# Patient Record
Sex: Female | Born: 1955 | Race: White | Hispanic: No | Smoking: Never smoker
Health system: Southern US, Community
[De-identification: ages and names within clinical notes are randomized; demographics above are authoritative.]

## PROBLEM LIST (undated history)

## (undated) DIAGNOSIS — E785 Hyperlipidemia, unspecified: Secondary | ICD-10-CM

## (undated) DIAGNOSIS — Z9289 Personal history of other medical treatment: Secondary | ICD-10-CM

## (undated) DIAGNOSIS — K635 Polyp of colon: Secondary | ICD-10-CM

## (undated) DIAGNOSIS — O039 Complete or unspecified spontaneous abortion without complication: Secondary | ICD-10-CM

## (undated) DIAGNOSIS — F419 Anxiety disorder, unspecified: Secondary | ICD-10-CM

## (undated) DIAGNOSIS — Q913 Trisomy 18, unspecified: Secondary | ICD-10-CM

## (undated) DIAGNOSIS — F32A Depression, unspecified: Secondary | ICD-10-CM

## (undated) DIAGNOSIS — F439 Reaction to severe stress, unspecified: Secondary | ICD-10-CM

## (undated) DIAGNOSIS — I340 Nonrheumatic mitral (valve) insufficiency: Secondary | ICD-10-CM

## (undated) DIAGNOSIS — F329 Major depressive disorder, single episode, unspecified: Secondary | ICD-10-CM

## (undated) DIAGNOSIS — I493 Ventricular premature depolarization: Secondary | ICD-10-CM

## (undated) HISTORY — PX: MYOMECTOMY: SHX85

## (undated) HISTORY — DX: Anxiety disorder, unspecified: F41.9

## (undated) HISTORY — DX: Polyp of colon: K63.5

## (undated) HISTORY — DX: Major depressive disorder, single episode, unspecified: F32.9

## (undated) HISTORY — DX: Ventricular premature depolarization: I49.3

## (undated) HISTORY — DX: Personal history of other medical treatment: Z92.89

## (undated) HISTORY — DX: Complete or unspecified spontaneous abortion without complication: O03.9

## (undated) HISTORY — DX: Depression, unspecified: F32.A

## (undated) HISTORY — DX: Hyperlipidemia, unspecified: E78.5

## (undated) HISTORY — DX: Trisomy 18, unspecified: Q91.3

## (undated) HISTORY — DX: Nonrheumatic mitral (valve) insufficiency: I34.0

## (undated) HISTORY — DX: Reaction to severe stress, unspecified: F43.9

---

## 1998-01-27 ENCOUNTER — Other Ambulatory Visit: Admission: RE | Admit: 1998-01-27 | Discharge: 1998-01-27 | Payer: Self-pay | Admitting: Obstetrics and Gynecology

## 1998-08-17 ENCOUNTER — Inpatient Hospital Stay (HOSPITAL_COMMUNITY): Admission: AD | Admit: 1998-08-17 | Discharge: 1998-08-20 | Payer: Self-pay | Admitting: Gynecology

## 1998-08-25 ENCOUNTER — Encounter (HOSPITAL_COMMUNITY): Admission: RE | Admit: 1998-08-25 | Discharge: 1998-11-23 | Payer: Self-pay | Admitting: Gynecology

## 1998-09-20 ENCOUNTER — Other Ambulatory Visit: Admission: RE | Admit: 1998-09-20 | Discharge: 1998-09-20 | Payer: Self-pay | Admitting: Gynecology

## 1999-05-16 ENCOUNTER — Other Ambulatory Visit: Admission: RE | Admit: 1999-05-16 | Discharge: 1999-05-16 | Payer: Self-pay | Admitting: *Deleted

## 1999-10-03 ENCOUNTER — Ambulatory Visit (HOSPITAL_COMMUNITY): Admission: RE | Admit: 1999-10-03 | Discharge: 1999-10-03 | Payer: Self-pay | Admitting: *Deleted

## 1999-10-03 ENCOUNTER — Encounter: Payer: Self-pay | Admitting: *Deleted

## 1999-11-06 ENCOUNTER — Other Ambulatory Visit: Admission: RE | Admit: 1999-11-06 | Discharge: 1999-11-06 | Payer: Self-pay | Admitting: Internal Medicine

## 2000-04-21 ENCOUNTER — Inpatient Hospital Stay (HOSPITAL_COMMUNITY): Admission: AD | Admit: 2000-04-21 | Discharge: 2000-04-23 | Payer: Self-pay | Admitting: *Deleted

## 2000-04-21 ENCOUNTER — Encounter (INDEPENDENT_AMBULATORY_CARE_PROVIDER_SITE_OTHER): Payer: Self-pay | Admitting: Specialist

## 2001-02-12 ENCOUNTER — Other Ambulatory Visit: Admission: RE | Admit: 2001-02-12 | Discharge: 2001-02-12 | Payer: Self-pay | Admitting: Gynecology

## 2001-03-11 ENCOUNTER — Encounter: Admission: RE | Admit: 2001-03-11 | Discharge: 2001-06-09 | Payer: Self-pay | Admitting: Gynecology

## 2001-03-20 ENCOUNTER — Ambulatory Visit (HOSPITAL_COMMUNITY): Admission: RE | Admit: 2001-03-20 | Discharge: 2001-03-20 | Payer: Self-pay | Admitting: Gynecology

## 2001-03-20 ENCOUNTER — Encounter: Payer: Self-pay | Admitting: Gynecology

## 2002-03-05 ENCOUNTER — Other Ambulatory Visit: Admission: RE | Admit: 2002-03-05 | Discharge: 2002-03-05 | Payer: Self-pay | Admitting: Gynecology

## 2002-03-22 ENCOUNTER — Encounter: Payer: Self-pay | Admitting: Gynecology

## 2002-03-22 ENCOUNTER — Ambulatory Visit (HOSPITAL_COMMUNITY): Admission: RE | Admit: 2002-03-22 | Discharge: 2002-03-22 | Payer: Self-pay | Admitting: Gynecology

## 2002-09-09 HISTORY — PX: CARPAL TUNNEL RELEASE: SHX101

## 2003-03-23 ENCOUNTER — Ambulatory Visit (HOSPITAL_BASED_OUTPATIENT_CLINIC_OR_DEPARTMENT_OTHER): Admission: RE | Admit: 2003-03-23 | Discharge: 2003-03-23 | Payer: Self-pay | Admitting: *Deleted

## 2003-04-12 ENCOUNTER — Ambulatory Visit (HOSPITAL_COMMUNITY): Admission: RE | Admit: 2003-04-12 | Discharge: 2003-04-12 | Payer: Self-pay | Admitting: Gynecology

## 2003-04-12 ENCOUNTER — Encounter: Payer: Self-pay | Admitting: Gynecology

## 2003-04-13 ENCOUNTER — Other Ambulatory Visit: Admission: RE | Admit: 2003-04-13 | Discharge: 2003-04-13 | Payer: Self-pay | Admitting: Gynecology

## 2004-02-03 ENCOUNTER — Encounter: Admission: RE | Admit: 2004-02-03 | Discharge: 2004-02-09 | Payer: Self-pay | Admitting: Family Medicine

## 2004-04-20 ENCOUNTER — Ambulatory Visit (HOSPITAL_COMMUNITY): Admission: RE | Admit: 2004-04-20 | Discharge: 2004-04-20 | Payer: Self-pay | Admitting: Gynecology

## 2004-05-21 ENCOUNTER — Other Ambulatory Visit: Admission: RE | Admit: 2004-05-21 | Discharge: 2004-05-21 | Payer: Self-pay | Admitting: Gynecology

## 2005-01-28 ENCOUNTER — Encounter: Admission: RE | Admit: 2005-01-28 | Discharge: 2005-03-04 | Payer: Self-pay | Admitting: Family Medicine

## 2005-05-17 ENCOUNTER — Ambulatory Visit (HOSPITAL_COMMUNITY): Admission: RE | Admit: 2005-05-17 | Discharge: 2005-05-17 | Payer: Self-pay | Admitting: Gynecology

## 2005-05-27 ENCOUNTER — Other Ambulatory Visit: Admission: RE | Admit: 2005-05-27 | Discharge: 2005-05-27 | Payer: Self-pay | Admitting: Gynecology

## 2005-05-29 ENCOUNTER — Encounter: Admission: RE | Admit: 2005-05-29 | Discharge: 2005-05-29 | Payer: Self-pay | Admitting: Gynecology

## 2005-09-27 ENCOUNTER — Encounter (INDEPENDENT_AMBULATORY_CARE_PROVIDER_SITE_OTHER): Payer: Self-pay | Admitting: Specialist

## 2005-09-27 ENCOUNTER — Ambulatory Visit (HOSPITAL_BASED_OUTPATIENT_CLINIC_OR_DEPARTMENT_OTHER): Admission: RE | Admit: 2005-09-27 | Discharge: 2005-09-27 | Payer: Self-pay | Admitting: Surgery

## 2006-03-04 HISTORY — PX: TOTAL ABDOMINAL HYSTERECTOMY W/ BILATERAL SALPINGOOPHORECTOMY: SHX83

## 2006-03-05 ENCOUNTER — Encounter (INDEPENDENT_AMBULATORY_CARE_PROVIDER_SITE_OTHER): Payer: Self-pay | Admitting: *Deleted

## 2006-03-05 ENCOUNTER — Inpatient Hospital Stay (HOSPITAL_COMMUNITY): Admission: RE | Admit: 2006-03-05 | Discharge: 2006-03-06 | Payer: Self-pay | Admitting: Gynecology

## 2006-05-30 ENCOUNTER — Encounter: Admission: RE | Admit: 2006-05-30 | Discharge: 2006-05-30 | Payer: Self-pay | Admitting: Gynecology

## 2006-06-02 ENCOUNTER — Other Ambulatory Visit: Admission: RE | Admit: 2006-06-02 | Discharge: 2006-06-02 | Payer: Self-pay | Admitting: Gynecology

## 2007-07-06 ENCOUNTER — Other Ambulatory Visit: Admission: RE | Admit: 2007-07-06 | Discharge: 2007-07-06 | Payer: Self-pay | Admitting: Gynecology

## 2007-09-10 DIAGNOSIS — K635 Polyp of colon: Secondary | ICD-10-CM

## 2007-09-10 HISTORY — DX: Polyp of colon: K63.5

## 2008-02-11 ENCOUNTER — Encounter: Admission: RE | Admit: 2008-02-11 | Discharge: 2008-02-11 | Payer: Self-pay | Admitting: Gynecology

## 2008-07-25 ENCOUNTER — Encounter: Payer: Self-pay | Admitting: Gynecology

## 2008-07-25 ENCOUNTER — Ambulatory Visit: Payer: Self-pay | Admitting: Gynecology

## 2008-07-25 ENCOUNTER — Other Ambulatory Visit: Admission: RE | Admit: 2008-07-25 | Discharge: 2008-07-25 | Payer: Self-pay | Admitting: Gynecology

## 2008-08-10 ENCOUNTER — Ambulatory Visit: Payer: Self-pay | Admitting: Gynecology

## 2009-02-08 ENCOUNTER — Ambulatory Visit (HOSPITAL_COMMUNITY): Admission: RE | Admit: 2009-02-08 | Discharge: 2009-02-09 | Payer: Self-pay | Admitting: Orthopedic Surgery

## 2009-06-20 ENCOUNTER — Ambulatory Visit: Payer: Self-pay | Admitting: Gynecology

## 2009-10-31 ENCOUNTER — Other Ambulatory Visit: Admission: RE | Admit: 2009-10-31 | Discharge: 2009-10-31 | Payer: Self-pay | Admitting: Gynecology

## 2009-10-31 ENCOUNTER — Ambulatory Visit: Payer: Self-pay | Admitting: Gynecology

## 2009-10-31 ENCOUNTER — Encounter: Payer: Self-pay | Admitting: Family Medicine

## 2009-12-08 ENCOUNTER — Encounter: Admission: RE | Admit: 2009-12-08 | Discharge: 2009-12-08 | Payer: Self-pay | Admitting: Gynecology

## 2009-12-22 ENCOUNTER — Encounter: Payer: Self-pay | Admitting: Family Medicine

## 2009-12-22 ENCOUNTER — Ambulatory Visit: Payer: Self-pay | Admitting: Gynecology

## 2010-04-09 ENCOUNTER — Ambulatory Visit: Payer: Self-pay | Admitting: Family Medicine

## 2010-04-09 DIAGNOSIS — E7800 Pure hypercholesterolemia, unspecified: Secondary | ICD-10-CM

## 2010-04-09 DIAGNOSIS — Z860100 Personal history of colon polyps, unspecified: Secondary | ICD-10-CM | POA: Insufficient documentation

## 2010-04-09 HISTORY — DX: Pure hypercholesterolemia, unspecified: E78.00

## 2010-10-09 NOTE — Letter (Signed)
Summary: Notes Dated 06-20-09 thru 11-01-09/[facility]  Notes Dated 06-20-09 thru 11-01-09/[facility]   Imported By: Lanelle Bal 04/18/2010 12:22:36  _____________________________________________________________________  External Attachment:    Type:   Image     Comment:   External Document

## 2010-10-09 NOTE — Assessment & Plan Note (Signed)
Summary: new to estab/cbs   Vital Signs:  Patient profile:   55 year old female Height:      62 inches (157.48 cm) Weight:      116.50 pounds (52.95 kg) BMI:     21.39 Temp:     98.4 degrees F (36.89 degrees C) oral BP sitting:   110 / 70  (right arm) Cuff size:   regular  Vitals Entered By: Lucious Groves CMA (April 09, 2010 10:19 AM) CC: NP est care./kb Is Patient Diabetic? No Pain Assessment Patient in pain? no        History of Present Illness: Pt here to establish.  Pt only c/o tender "lymph nodes" under arms b/l R>L.  Mammogram normal .   They recommended [place] but Dr Lily Peer did not feel anything at the time so it was not ordered.   No other complaints.   Symptoms since January.    Current Medications (verified): 1)  Hormone Patch .... Twice Per Week 2)  Mvi .... Daily 3)  Biotin .... Daily 4)  Calcium .... Daily 5)  Vitamin D3 .... Daily 6)  Fish Oil .... Daily 7)  Tumeric .... Daily  Allergies (verified): 1)  ! Amoxicillin (Amoxicillin)  Past History:  Family History: Last updated: 04/09/2010 Family History High cholesterol Family History Hypertension  Social History: Last updated: 04/09/2010 Occupation: hairstylist  Past Medical History: Colonic polyps, hx of Hyperlipidemia  Past Surgical History: Caesarean section x2 Carpal tunnel release--2004 Broken Ankle--2010 Hysterectomy--2008 Breast  Bx--2007 Fibroid removal--1997  Family History: Reviewed history and no changes required. Family History High cholesterol Family History Hypertension  Social History: Reviewed history and no changes required. Occupation: Scientist, research (medical) Occupation:  employed  Review of Systems      See HPI  Physical Exam  General:  Well-developed,well-nourished,in no acute distress; alert,appropriate and cooperative throughout examination Neck:  No deformities, masses, or tenderness noted. Lungs:  Normal respiratory effort, chest expands symmetrically. Lungs are  clear to auscultation, no crackles or wheezes. Heart:  normal rate and no murmur.   Extremities:  No clubbing, cyanosis, edema, or deformity noted with normal full range of motion of all joints.   Cervical Nodes:  No lymphadenopathy noted Axillary Nodes:  No palpable lymphadenopathy Psych:  Cognition and judgment appear intact. Alert and cooperative with normal attention span and concentration. No apparent delusions, illusions, hallucinations   Impression & Recommendations:  Problem # 1:  ? of LYMPHADENOPATHY, AXILLA (ICD-785.6) nothing palpated today pt left before we could get labs from gyn--- ( she went to bathroom and thought she was done) we are in process of getting labs done by gyn--- they we will call pt if more labs are needed Otherwise pt should return when symptoms return  Complete Medication List: 1)  Hormone Patch  .... Twice per week 2)  Mvi  .... Daily 3)  Biotin  .... Daily 4)  Calcium  .... Daily 5)  Vitamin D3  .... Daily 6)  Fish Oil  .... Daily 7)  Tumeric  .... Daily  Other Orders: Tdap => 84yrs IM [phone]) Admin 1st Vaccine ([id number])  Preventive Care Screening  Mammogram:    Date:  10/10/2009    Results:  normal   Pap Smear:    Date:  10/10/2009    Results:  normal   Colonoscopy:    Date:  09/09/2006    Results:  polyps    Flu Vaccine Result Date:  06/19/2005 Flu Vaccine Result:  given Flu Vaccine Next Due:  1 yr Colonoscopy Result Date:  04/22/2007 Colonoscopy Result:  polyps Colonoscopy Next Due:  5 yr PAP Result Date:  10/25/2009 PAP Result:  normal PAP Next Due:  1 yr Mammogram Result Date:  10/18/2009 Mammogram Result:  normal Mammogram Next Due:  1 yr   Immunizations Administered:  Tetanus Vaccine:    Vaccine Type: Tdap    Site: right deltoid    Mfr: GlaxoSmithKline    Dose: 0.5 ml    Route: IM    Given by: Lucious Groves CMA    Exp. Date: 12/02/2011    Lot #: [id number]    VIS given: 07/28/07 version given April 09, 2010.   Appended Document: new to estab/cbs OV notes from Dr Lily Peer received---- only cmp done---- we need to do CBCD at pt convenience  786.5   Clinical Lists Changes       Appended Document: new to estab/cbs Left message on machine to call back to office and schedule labs.  Appended Document: new to estab/cbs left detail message with pt husband to have pt to call to schedule lab appt for cbcd 786.5

## 2010-10-09 NOTE — Letter (Signed)
Summary: [facility]   Imported By: Lanelle Bal 04/18/2010 12:21:23  _____________________________________________________________________  External Attachment:    Type:   Image     Comment:   External Document

## 2010-10-09 NOTE — Letter (Signed)
Summary: [facility]   Imported By: Lanelle Bal 04/18/2010 12:25:28  _____________________________________________________________________  External Attachment:    Type:   Image     Comment:   External Document

## 2010-11-05 ENCOUNTER — Ambulatory Visit (INDEPENDENT_AMBULATORY_CARE_PROVIDER_SITE_OTHER): Payer: BC Managed Care – PPO | Admitting: Nurse Practitioner

## 2010-11-05 DIAGNOSIS — R079 Chest pain, unspecified: Secondary | ICD-10-CM

## 2010-11-05 DIAGNOSIS — I10 Essential (primary) hypertension: Secondary | ICD-10-CM

## 2010-11-12 ENCOUNTER — Telehealth (INDEPENDENT_AMBULATORY_CARE_PROVIDER_SITE_OTHER): Payer: Self-pay | Admitting: Radiology

## 2010-11-13 ENCOUNTER — Encounter: Payer: Self-pay | Admitting: Cardiology

## 2010-11-13 ENCOUNTER — Encounter (INDEPENDENT_AMBULATORY_CARE_PROVIDER_SITE_OTHER): Payer: Self-pay | Admitting: *Deleted

## 2010-11-13 ENCOUNTER — Ambulatory Visit (HOSPITAL_COMMUNITY): Payer: BC Managed Care – PPO | Attending: Cardiology

## 2010-11-13 DIAGNOSIS — R0609 Other forms of dyspnea: Secondary | ICD-10-CM

## 2010-11-13 DIAGNOSIS — R079 Chest pain, unspecified: Secondary | ICD-10-CM | POA: Insufficient documentation

## 2010-11-13 DIAGNOSIS — R0602 Shortness of breath: Secondary | ICD-10-CM

## 2010-11-13 DIAGNOSIS — R9431 Abnormal electrocardiogram [ECG] [EKG]: Secondary | ICD-10-CM

## 2010-11-13 DIAGNOSIS — R0789 Other chest pain: Secondary | ICD-10-CM

## 2010-11-16 ENCOUNTER — Ambulatory Visit (INDEPENDENT_AMBULATORY_CARE_PROVIDER_SITE_OTHER): Payer: BC Managed Care – PPO | Admitting: Nurse Practitioner

## 2010-11-16 DIAGNOSIS — I10 Essential (primary) hypertension: Secondary | ICD-10-CM

## 2010-11-16 DIAGNOSIS — E7800 Pure hypercholesterolemia, unspecified: Secondary | ICD-10-CM

## 2010-11-20 NOTE — Assessment & Plan Note (Signed)
Summary: Cardiology Nuclear Testing  Nuclear Med Background Indications for Stress Test: Evaluation for Ischemia   History: Abnormal EKG, Myocardial Perfusion Study   Symptoms: Chest Pain, Chest Pressure, DOE, Fatigue, Palpitations, SOB    Nuclear Pre-Procedure Cardiac Risk Factors: Hypertension, Lipids Caffeine/Decaff Intake: none NPO After: 12:30 AM Lungs: clear IV 0.9% NS with Angio Cath: 18g     IV Site: R Antecubital IV Started by: Stanton Kidney, EMT-P Chest Size (in) 32     Cup Size B     Height (in): 62 Weight (lb): 118 BMI: 21.66 Tech Comments: Bystolic held > 24 hours, per patient.  Nuclear Med Study 1 or 2 day study:  1 day     Stress Test Type:  Stress Reading MD:  Cassell Clement, MD     Referring MD:  T.Brackbill Resting Radionuclide Dose:  11 mCi  Stress Radionuclide Dose:  32.9 mCi   Stress Protocol Exercise Time (min):  11:00 min     Max HR:  142 bpm     Predicted Max HR:  167 bpm  Max Systolic BP: 142 mm Hg     Percent Max HR:  85.03 %     METS: 13.3 Rate Pressure Product:  16109    Stress Test Technologist:  Milana Na, EMT-P     Nuclear Technologist:  Domenic Polite, CNMT  Rest Procedure  Myocardial perfusion imaging was performed at rest 45 minutes following the intravenous administration of Technetium 69m Tetrofosmin.  Stress Procedure  The patient exercised for 11:00. The patient stopped due to fatigue, doe, and denied any chest pain.  There were non specific ST-T wave changes.  Technetium 6m Tetrofosmin was injected at peak exercise and myocardial perfusion imaging was performed after a brief delay.  QPS Raw Data Images:  Normal; no motion artifact; normal heart/lung ratio. Stress Images:  Normal homogeneous uptake in all areas of the myocardium. Rest Images:  Normal homogeneous uptake in all areas of the myocardium. Subtraction (SDS):  No evidence of ischemia. Transient Ischemic Dilatation:  1.01  (Normal <1.22)  Lung/Heart Ratio:  .27   (Normal <0.45)  Quantitative Gated Spect Images QGS EDV:  87 ml QGS ESV:  44 ml QGS EF:  50 % QGS cine images:  No wall motion abnormalities  Findings Normal nuclear study      Overall Impression  Exercise Capacity: Good exercise capacity. BP Response: Normal blood pressure response. Clinical Symptoms: No chest pain ECG Impression: No significant ST segment change suggestive of ischemia. Overall Impression: Normal stress nuclear study.  Appended Document: Cardiology Nuclear Testing COPY SENT TO DR.BRACKBILL

## 2010-11-20 NOTE — Progress Notes (Signed)
Summary: Nuclear Pre-Procedure  Phone Note Outgoing Call Call back at [facility] Phone [phone]   Call placed by: Stanton Kidney, EMT-P,  November 12, 2010 3:47 PM Call placed to: Patient Action Taken: Phone Call Completed Reason for Call: Confirm/change Appt Summary of Call: Reviewed information on Myoview Information Sheet (see scanned document for further details).  Spoke with the patient; husband. Stanton Kidney, EMT-P  November 12, 2010 3:47 PM      Nuclear Med Background Indications for Stress Test: Evaluation for Ischemia   History: Abnormal EKG, Myocardial Perfusion Study   Symptoms: Chest Pain, Chest Pressure, Palpitations    Nuclear Pre-Procedure Cardiac Risk Factors: Hypertension, Lipids Height (in): 62

## 2010-12-17 LAB — BASIC METABOLIC PANEL
BUN: 11 mg/dL (ref 6–23)
Calcium: 9.5 mg/dL (ref 8.4–10.5)
Chloride: 102 mEq/L (ref 96–112)
Creatinine, Ser: 0.68 mg/dL (ref 0.4–1.2)
GFR calc Af Amer: >60 mL/min (ref >60)
GFR calc non Af Amer: >60 mL/min (ref >60)
Glucose, Bld: 89 mg/dL (ref 70–99)
Potassium: 4.3 mEq/L (ref 3.5–5.1)

## 2010-12-17 LAB — CBC
HCT: 43.7 % (ref 36.0–46.0)
MCHC: 34.4 g/dL (ref 30.0–36.0)
MCV: 92.1 fL (ref 78.0–100.0)
WBC: 8.7 10*3/uL (ref 4.0–10.5)

## 2010-12-17 LAB — PROTIME-INR: Prothrombin Time: 13 seconds (ref 11.6–15.2)

## 2010-12-27 ENCOUNTER — Other Ambulatory Visit: Payer: Self-pay | Admitting: *Deleted

## 2010-12-27 DIAGNOSIS — E7800 Pure hypercholesterolemia, unspecified: Secondary | ICD-10-CM

## 2010-12-31 ENCOUNTER — Other Ambulatory Visit: Payer: BC Managed Care – PPO | Admitting: *Deleted

## 2011-01-22 NOTE — Op Note (Signed)
NAMETRINI, SOLDO              ACCOUNT NO.:  [id number]   MEDICAL RECORD NO.:  [id number]          PATIENT TYPE:  OIB   LOCATION:  2550                         FACILITY:  [facility]   PHYSICIAN:  Burnard Bunting, M.D.    DATE OF BIRTH:  [birth year]   DATE OF PROCEDURE:  02/08/2009  DATE OF DISCHARGE:                               OPERATIVE REPORT   PREOPERATIVE DIAGNOSIS:  Left ankle lateral malleolus fracture.   POSTOPERATIVE DIAGNOSIS:  Left ankle lateral malleolus fracture with  syndesmosis injury.   PROCEDURE:  Left ankle lateral malleolus fracture open reduction and  internal fixation with fixation of syndesmosis.   SURGEON:  Burnard Bunting, MD   ASSISTANT:  None.   ANESTHESIA:  General.   INDICATIONS:  [name] is a 55 year old patient who injured her  ankle while doing some type of surfing [place] the beach who presents now  with an unstable ankle and presents for operative management.   PROCEDURE IN DETAIL:  The patient was brought to the operating room  where general endotracheal anesthesia was induced.  Preoperative  antibiotics were administered.  Left ankle was prescrubbed with  chlorhexidine alcohol and Betadine which allowed to air dry, then  prepped with DuraPrep solution and draped in a sterile manner.  Collier Flowers  was used to cover the operative field.  Ankle Esmarch was utilized for  approximately an hour.  Lateral incision was made over the lateral  malleolus and extending up to the fibular shaft.  Skin and subcutaneous  tissue were sharply divided.  Fracture was identified.  Care was taken  to avoid injury to the anterior branch superficial peroneal nerve.  The  fracture was reduced and the lag screw was placed.  Good reduction of  the fracture was achieved in the AP and lateral planes under  fluoroscopy.  Lateral plate was then applied.  After the lateral plate  was applied, extensive syndesmotic inspection was performed.  The medial  clear space widened by  approximately 2.5 to 3 mm with lateral stress.  This was observed under live fluoroscopy.  With stress, there was also  an audible vacuum phenomenon where air was being suctioned to the joint  because of the lability of the talus.  Even though the fracture pattern  itself was not necessarily consistent with syndesmotic injury, the  actual live fluoroscopic stress did demonstrate medial clear space  widening with lateral stress after fixation of the fibula.  For this  reason, a single syndesmotic screw was placed through the plate at about  1.5 cm above the joint, which did give a stable fixation and improved  the manual stability and fluorographic stability of the ankle.  At this  time, incision was thoroughly irrigated.  Ankle Esmarch  was released.  The incision was then closed using interrupted inverted 3-  0 Vicryl suture and 3-0 nylon suture.  The patient tolerated the  procedure well without immediate complications.  Bulky well-padded  splint was applied.  She was transferred to recovery room in stable  condition.      Burnard Bunting, M.D.  Electronically Signed  GSD/MEDQ  D:  02/08/2009  T:  02/09/2009  Job:  [id number]

## 2011-01-24 ENCOUNTER — Telehealth: Payer: Self-pay | Admitting: Cardiology

## 2011-01-24 NOTE — Telephone Encounter (Signed)
Has not slept much since Friday.  Husband passed away on Jan 21, 2023.  blood pressure 150/119 and was very short of breath.  She thought it was just anxiety until she took blood pressure.  Took 2 bystolic yesterday.  Took 1st 1 and blood pressure down 140's/87-95 about 2 1/2 hours later, so she took a second pill for a total of 10 mg bystolic.  Wants to know if she should increase to 10 mg daily and if so should she do bid or both at one time.  Please advise.

## 2011-01-24 NOTE — Telephone Encounter (Signed)
States she will call back if needs xanax.  Advised to call if systolic below 100

## 2011-01-24 NOTE — Telephone Encounter (Signed)
Advised to increase, doesn't want any xanax at this time.

## 2011-01-24 NOTE — Telephone Encounter (Signed)
Increase to 10 mg bystolic all at one time. does she also needs something for anxiety such as Xanax ?

## 2011-01-24 NOTE — Telephone Encounter (Signed)
PT'S HUSBAND RECENTLY PASSED AWAY UNEXPECTANTLY, SHE IS HAVING PROBLEMS WITH HER BP AND NEEDS TO SW SOMEONE AS TO WHAT SHE NEEDS TO DO WITH HER MEDS. CHART IN BOX.

## 2011-01-25 NOTE — Op Note (Signed)
[name], [name]              ACCOUNT NO.:  [id number]   MEDICAL RECORD NO.:  [id number]          PATIENT TYPE:  AMB   LOCATION:  SDC                           FACILITY:  [facility]   PHYSICIAN:  Juan H. Lily Peer, M.D.DATE OF BIRTH:  May 20, 1956   DATE OF PROCEDURE:  03/05/2006  DATE OF DISCHARGE:                                 OPERATIVE REPORT   INDICATIONS FOR OPERATION:  55 year old gravida 4, para 1, ab 3 with  symptomatic leiomyomata uteri.   PREOPERATIVE DIAGNOSES:  1.  Leiomyomata uteri.  2. Pelvic pain.  3. Postmenopausal bleeding.  4.      Postmenopausal.   POSTOPERATIVE DIAGNOSES:  1.  Leiomyomata uteri.  2. Pelvic pain.  3. Postmenopausal bleeding.  4.      Postmenopausal.   ANESTHESIA:  General endotracheal anesthesia.   PROCEDURE PERFORMED:  1.  Abdominal pelvic adhesive lysis.  2.  Total abdominal hysterectomy, bilateral salpingo-oophorectomy.   FIRST ASSISTANT:  Douglass Rivers, MD   FINDINGS:  Patient with a 14 weeks' size uterus adhered to the anterior  abdominal wall with thick adhesions.  Normal-appearing tubes and ovaries,  and normal-appearing appendix.   DESCRIPTION OF OPERATION:  After the patient was adequately counseled, she  was taken to the operating room where she underwent successful general  endotracheal anesthesia.  She had received clindamycin 900 mg IV for  prophylaxis and PSA stockings were in place for DVT prophylaxis.  After  general endotracheal anesthesia was obtained, the abdomen was prepped and  draped in usual sterile fashion.  A Foley catheter was inserted in an effort  to monitor urinary output.  After the drapes were placed, a Pfannenstiel  skin incision was made adjacent to the previous Pfannenstiel scar.  The  incision was carried out the skin, subcutaneous tissue down to the rectus  fascia whereby midline nick was made, the fascia was incised in transverse  fashion.  The peritoneal cavity was entered cautiously requiring  meticulous  dissection to separate the uterus from the anterior surface of the  peritoneum.  Once this was accomplished, the uterus with the enlarged  fibroids was brought out the operative field and the right round ligament  was identified, was suture ligated with 0 Vicryl suture.  The right round  ligament was transected.  The anterior broad ligament was incised peeling  down the bladder.  The posterior broad ligament was penetrated with  surgeon's finger and the left infundibulopelvic ligament was clamped, cut  and suture ligated with 0 Vicryl suture followed by a free tie of 0 Vicryl  suture.  In similar procedure was carried out on the contralateral side  until the remaining broad cardinal ligaments were serially clamped, cut and  suture ligated with 0 Vicryl suture to the level of the vaginal fornices.  Two curved Heaney clamps were placed and the cervix was excised from the  vagina and cervix, uterus, tubes and ovary were passed off to the operative  field for histological evaluation.  The angles were secured with a  transfixion stitch of 0 Vicryl suture and the remaining vaginal cuff was  closed  with figure-of-eight of 0 Vicryl suture.  The pelvic cavity was  copiously irrigated with normal saline solution.  Surgicel was placed onto  the raw surfaces of the peritoneum for additional hemostasis.  Sponge and  needle count were correct.  The rectus muscle was reapproximated in the  midline with several interrupted sutures to prevent diastasis recti.  Following this, the rectus fascia was then closed with a running stitch of 0  Vicryl suture.  Subcutaneous bleeders were Bovie cauterized.  The skin was  reapproximated with a subcuticular stitch of 3-0 Vicryl suture on a Keith  needle followed by placement of Steri-Strips and pressure dressing.  The  patient was extubated, transferred to recovery room with stable vital signs.  Blood loss from procedure was 150 mL.  Urine output was 50 mL  and clear. IV  fluid 1100 mL lactated Ringer's.      Juan H. Lily Peer, M.D.  Electronically Signed     Gaetano Hawthorne. Lily Peer, M.D.  Electronically Signed    JHF/MEDQ  D:  03/05/2006  T:  03/05/2006  Job:  [id number]

## 2011-01-25 NOTE — Discharge Summary (Signed)
[name], [name]              ACCOUNT NO.:  [id number]   MEDICAL RECORD NO.:  [id number]          PATIENT TYPE:  INP   LOCATION:  9312                          FACILITY:  [facility]   PHYSICIAN:  Juan H. Lily Peer, M.D.DATE OF BIRTH:  12-12-1955   DATE OF ADMISSION:  03/05/2006  DATE OF DISCHARGE:  03/06/2006                                 DISCHARGE SUMMARY   HISTORY:  The patient is a 55 year old gravida 4 para 1 NB 3 with  symptomatic leiomyomatous uteri who underwent a total abdominal hysterectomy  with bilateral salpingo-oophorectomy by Dr. Gaetano Hawthorne. Lily Peer and assisted  by Dr. Douglass Rivers.  The patient had a 14-week sized uterus that had  adhered to the abdominal wall and was thick and had adhesions;  otherwise,  normal appearing tubes and ovaries and normal-appearing appendix.  The  pathology report had demonstrated benign cervix, benign proliferative  endometrium, adenomyosis and benign leiomyomas and serosa with adhesions.  The right and left ovary were, otherwise, normal with benign paratubal  cysts.  The patient's first postoperative day, the patient in the morning  had a hemoglobin of 9.4, hematocrit 28.1, platelet count 226,000.  The  patient had been placed on the Climara transdermal patch 0.1 mg.  Her urine  output since her surgery had adequate output and it was discontinued that  morning.  She was started on a clear liquid diet and began to ambulate.  Her  vital signs were stable with temperature max at 98.8.  On evening rounds,  the day after her surgery, her vital signs continued to be stable.  She had  been afebrile.  She had been tolerating a regular diet, had passed flatus,  was in good spirits, and was ready to be discharged home.   FINAL DIAGNOSES:  1. Symptomatic leiomyomatous uteri.  2. Adenomyosis.   PROCEDURE PERFORMED:  Total abdominal hysterectomy weight bearing bilateral  salpingo-oophorectomy.   FINAL DISPOSITION/FOLLOWUP:  The patient was  discharged home a day and a  half after her surgery.  She was given a prescription of Darvocet to take 1  by mouth every 4-6 hours as needed for pain.  She was to continue the  Climara transdermal patch of 0.1 mg once a week.  She was given a  prescription also for Reglan 10 mg to take 1 by mouth every 4- 6 hours as  needed for pain and she was instructed to followup in the office in 2 weeks  for a postop visit.      Juan H. Lily Peer, M.D.  Electronically Signed     JHF/MEDQ  D:  04/05/2006  T:  04/05/2006  Job:  [id number]

## 2011-01-25 NOTE — Op Note (Signed)
[facility]  Patient:    KEITRA, CARUSONE                  MRN: [id number] Proc. Date: 04/21/00 Adm. Date:  [id number] Attending:  Wetzel Bjornstad                           Operative Report  PREOPERATIVE DIAGNOSES:       1. Thirty-three-week intrauterine pregnancy.                               2. Trisomy 18.                               3. Polyhydramnios.                               4. Multiple fetal anomalies.                               5. Previous uterine surgery.  POSTOPERATIVE DIAGNOSES:      1. Thirty-three-week intrauterine pregnancy.                               2. Trisomy 18.                               3. Polyhydramnios.                               4. Multiple fetal anomalies.                               5. Previous uterine surgery.  PROCEDURE:                    Repeat low transverse cesarean section.  SURGEON:                      Katy Fitch, M.D.  ASSISTANTGaetano Hawthorne. Lily Peer, M.D.  ANESTHESIA:                   Spinal.  ESTIMATED BLOOD LOSS:         850.  URINE OUTPUT:                 100.  FLUIDS:                       3300 of crystalloid.  COMPLICATIONS:                None.  SPECIMENS:                    Cord blood and placenta.  FINDINGS:                     A living fetus with trisomy 31, with Apgars unknown at time of dictation.  There  were noted to a three-vessel cord, small placenta, normal appearing uterus, tubes and ovaries.  COMPLICATIONS:                None.  INDICATIONS:                  Patient is a 54 year old G2, P1 at 4 weeks with known trisomy 82, admitted for repeat cesarean section secondary to polyhydramnios and risk of uterine rupture.  Patient was counseled extensively on risks of surgery and desired to perform cesarean section before enlargement of the uterus could potentially cause uterine rupture.  Patient understands procedure and risks for both the  surgery and to the fetus.  DESCRIPTION OF PROCEDURE:     After signing consents, the patient was prepped and draped in normal sterile fashion and placed on her left lateral side.  A skin incision was made through her prior Pfannenstiel incision and carried down to underlying fascia.  Fascia was then incised with a scalpel and extended transversely with curved Mayo scissors.  The fascial border of the superior aspect was grasped with Kocher clamps and the underlying rectus muscles were dissected off sharply with curved Mayo scissors.  In a similar fashion, the inferior aspects of the fascia were grasped with Kocher clamps and the rectus muscles dissected off sharply with Mayo scissors.  The rectus muscles were then separated in the midline sharply with a hemostat and entrance into the abdominal cavity was noted.  The rectus muscles were separated sharply using Mayo scissors superiorly and then inferiorly, with good visualization of the bladder.  Bladder blade was then placed into the pelvis and the vesicouterine peritoneum was then identified and grasped with pickups and entered sharply with Metzenbaum scissors.  Bladder flap was then extended transversely and the bladder flap was grasped with hemostats and the bladder flap was created digitally.  The bladder blade was then reinserted in the newly created bladder flap and a low transverse uterine incision was made with a scalpel.  The incision was extended manually and the infants head was then delivered, after being in a floating position, using a vacuum device and was delivered atraumatically.  The suction was taken off, the infants mouth and nose were then suctioned, the cord was clamped and cut and the infant handed off to the awaiting attendants.  The placenta was then massaged manually from the uterus and the uterus was then exteriorized and cleared of all clots and debris.  A running 0 chromic of interlocking stitch was then used to  close the lower uterine segment and a second stitch of 0 Vicryl was used in a Limberg stitch to obtain hemostasis.  The posterior cul-de-sac was then cleared of all clots and debris and the uterus was returned to the pelvis.  The pelvic gutters were then cleared of all clots and debris and the pelvis was irrigated with warm normal saline.  Several areas below the uterine incision were noted to have bleeders and were cauterized with electrocautery. The rectus muscles were then reapproximated using 3-0 Vicryl in interrupted stitches, which took two approximately to reapproximate the muscles.  The fascia was then reapproximated with 0 Vicryl, starting at each angle and meeting in the midline.  The subcutaneous tissue was then irrigated and electrocautery was used to obtain hemostasis.  The skin was closed using staples.  Patient was then taken to the recovery room in stable condition. DD:  04/21/00 TD:  04/22/00 Job: [id number]

## 2011-01-25 NOTE — Op Note (Signed)
NAMECRYSTIE, [name]              ACCOUNT NO.:  [id number]   MEDICAL RECORD NO.:  [id number]            PATIENT TYPE:   LOCATION:                                 FACILITY:   PHYSICIAN:  Thomas A. Cornett, M.D.     DATE OF BIRTH:   DATE OF PROCEDURE:  09/27/2005  DATE OF DISCHARGE:                                 OPERATIVE REPORT   PREOP DIAGNOSIS:  Left breast mass.   POSTOP DIAGNOSIS:  Left breast mass.   PROCEDURE:  Open left breast biopsy.   SURGEON:  Maisie Fus A. Cornett, M.D.   ANESTHESIA:  MAC with 2.5% Sensorcaine local   ESTIMATED BLOOD LOSS:  10 mL.   INDICATIONS FOR PROCEDURE:  The patient is a 55 year old female who is found  to have an oval density in her left breast at around the 11 o'clock  position. It was unclear the etiology of this. This was felt to be benign. I  had discussion with the patient about biopsy options which include core  biopsy, versus observation, versus excision. The patient wishes to have this  area removed as opposed to core biopsy or observation.  After receiving  informed consent, the patient agreed to proceed.   DESCRIPTION OF PROCEDURE:  The patient was brought to the operating room,  placed supine. After induction of MAC anesthesia, the left breast was  prepped and draped in a sterile fashion. The area was marked preoperatively  with a pen. The area was located at about the 11 o'clock position. Local  anesthesia was infiltrated at the border of the nipple and skin.   A curved linear incision was made over the area from about 10 o'clock to 12  o'clock around the nipple. I was able to palpate some tissue that  corresponded to the palpable and ultrasound abnormality. I grasped it with  an Allis clamp and excised. There is a small cyst associated with it as well  with some clear fluid that was associated with it. Tissue was sent to  pathology for evaluation. Hemostasis was excellent. Then 4-0 Monocryl was  used to close the skin.  Steri-Strips and dry dressings were applied. All  final count of sponge, needle, and instruments were found to be correct. The  patient was awoke, taken to recovery, in satisfactory condition.      Thomas A. Cornett, M.D.  Electronically Signed     TAC/MEDQ  D:  09/27/2005  T:  09/27/2005  Job:  [id number]

## 2011-01-25 NOTE — Discharge Summary (Signed)
[name], [name]              ACCOUNT NO.:  [id number]   MEDICAL RECORD NO.:  [id number]          PATIENT TYPE:  INP   LOCATION:  9312                          FACILITY:  [facility]   PHYSICIAN:  Juan H. Lily Peer, M.D.DATE OF BIRTH:  1956/05/06   DATE OF ADMISSION:  03/05/2006  DATE OF DISCHARGE:  03/06/2006                                 DISCHARGE SUMMARY   TOTAL DAYS HOSPITALIZED:  One.   HISTORY:  The patient is a 55 year old gravida 4, para 1, AB 3,  postmenopausal who underwent a transabdominal hysterectomy with bilateral  salpingo-oophorectomy on the morning of March 05, 2006, secondary to  symptomatic leiomyomatous uteri.  Patient had a blood loss of 150 mL during  her surgery, received 1100 mL of IV fluids intraoperatively and received  clindamycin 900 mg for prophylaxis.  The specimen had weight 487 g.   Patient is postoperative day #1, this morning her hemoglobin and hematocrit  was 9.4 and 28...   Dictation ended at this point.      Juan H. Lily Peer, M.D.  Electronically Signed     JHF/MEDQ  D:  03/06/2006  T:  03/06/2006  Job:  [id number]

## 2011-01-25 NOTE — Discharge Summary (Signed)
[facility]  Patient:    [name], [name]                  MRN: [id number] Adm. Date:  [id number] Disc. Date: [id number] Attending:  Wetzel Bjornstad Dictator:   Antony Contras, [facility]                           Discharge Summary  DISCHARGE DIAGNOSES:          1. Intrauterine pregnancy at 33 weeks.                               2. Polyhydramnios.                               3. Trisomy 18.                               4. History of previous cesarean section and                                  previous myomectomy.  PROCEDURE:                    Low cervical transverse cesarean section.  HISTORY OF PRESENT ILLNESS:   Patient is a 55 year old gravida 2, para 1-0-0-1 with an LMP of August 30, 1999, [facility] of June 05, 2000.  This pregnancy was complicated by advanced maternal age, history of previous myomectomies, history of previous group B strep.  Patient was noted to have ______ cysts on ultrasound on Jan 11, 2000.  She did decline MSAFP testing and amniocentesis.  She did develop polyhydramnios as the pregnancy progressed and also fetal anomalies were noted on ultrasound including heart wall defects, craniofacial abnormalities, absence of stomach, and crossing over the hand. She was referred to [facility] and underwent amniocentesis with FISH and was noted to have trisomy 18.  Cesarean section was recommended at 33 weeks to prevent uterine rupture and preterm labor due to the poor outcome of the fetus.  She was admitted on April 21, 2000.  Her low cervical transverse cesarean section was performed by Dr. Penni Homans, assisted by Dr. Lily Peer under spinal anesthesia.  Patient was delivered of a live 1060 g female infant.  Apgars were 5/4.  Infant did succumb shortly after delivery.  Postpartum course was uncomplicated.  She remained afebrile and had no difficulty voiding.  She did receive support through social  services for her grieving process and also for funeral arrangements for the infant. Postoperative CBC:  Hematocrit 33.9, hemoglobin 11.8, WBC 18.7, platelets 167. She was able to be discharged on her second postoperative day in satisfactory condition.  DISPOSITION:                  Follow up in the office on August 20 for staple removal.  Continue with prenatal vitamins and iron, Motrin, Tylox for pain.DD: 05/09/00 TD:  05/09/00 Job: [id number]

## 2011-01-25 NOTE — H&P (Signed)
[name], [name]              ACCOUNT NO.:  [id number]   MEDICAL RECORD NO.:  [id number]           PATIENT TYPE:   LOCATION:                                FACILITY:  [facility]   PHYSICIAN:  Juan H. Lily Peer, M.D.     DATE OF BIRTH:   DATE OF ADMISSION:  03/05/2006  DATE OF DISCHARGE:                                HISTORY & PHYSICAL   CHIEF COMPLAINT:  1.  [name].  2.  Postmenopausal bleeding.   HISTORY OF PRESENT ILLNESS:  The patient is a 55 year old gravida 4, para 1,  AB 3, who had been scheduled for an abdominal hysterectomy on several  occasions but the patient had backed out secondary to commitments and wanted  to wait longer.  She is post menopausal with FSH being elevated at 57 last  year.  The patient has had minimal vasomotor symptoms.  She had endometrial  biopsy in March 2007 secondary to some irregular bleeding and was negative  for malignancy.  Her uterus fundal measurement was all the way to the level  underneath the umbilicus.  She complains of heavy pressure sensations.  Sometimes she will spot, sometimes will not have a period.  Surprisingly her  iron level has been normal and was tested several months ago.  The patient  had been counseled as to risks, benefits, and pros and cons of the operation  and the patient has now decided to proceed with abdominal hysterectomy with  bilateral salpingo-oophorectomy.   PAST MEDICAL HISTORY:   ALLERGIES:  1.  The patient is allergic to CODEINE.  2.  MORPHINE.  3.  MEGACE.   PAST SURGICAL HISTORY:  1.  Two cesarean sections.  2.  One complete spontaneous AB.  3.  Myomectomy.  4.  Right carpal tunnel release.   MEDICATIONS:  1.  Vitamin E 600 units daily.  2.  Multivitamin.  3.  Flaxseed oil caltrated with Vitamin D.  4.  Biotin.   FAMILY HISTORY:  Father with history of hypertension.   PHYSICAL EXAMINATION:  VITAL SIGNS:  The patient weighs 109 pounds, she is 5  feet 2 inches tall.  Blood pressure  120/72.  HEENT:  Unremarkable.  NECK:  Supple.  Trachea midline.  No carotid bruits.  No thyromegaly.  LUNGS:  Clear to auscultation without any rhonchi or wheezes.  HEART:  Regular rate and rhythm.  No murmurs or gallop.  BREASTS:  Exam not done.  ABDOMEN:  Soft, nontender.  Fibroid uterus can be palpated underneath the  skin up to the level underneath the umbilicus.  PELVIC:  Bartholin's urethra, and Skeen glands within normal limits.  Vagina  and cervix:  No lesions or discharge.  Uterus 16-18 weeks' size.  Adnexa  difficult to evaluate secondary to the size of her uterus.  RECTAL:  Exam was deferred.   ASSESSMENT:  A 55 year old gravida 4, para 1, AB 3, postmenopausal, with  symptomatic [name], has had dysfunctional uterine bleeding but  not endometrial biopsy.  Pap smear within 12 months has been normal as well  as her serum iron level.  The patient is scheduled to undergo a total  abdominal hysterectomy, bilateral salpingo-oophorectomy.  The risks,  benefits, and pros and cons of the operation were discussed to include:  The  risk for infection, although she will received prophylaxis antibiotic.  The  risk for deep venous thrombosis with a potential risk for pulmonary  embolism.  She will have PSA stockings for prophylaxis.  Although in the  event of any technical difficulty the patient would need to have blood  transfusion and she would be in need of blood or blood products, she is  fully aware that those life-saving measures could potentially increase her  risk of hepatitis, AIDS, and anaphylactic reaction from blood or blood  products.  Also the risk of trauma to internal organs requiring corrective  surgery at that time as well.  All those issues were discussed with the  patient.  All questions were answered and will follow accordingly.   PLAN:  The patient is scheduled for total abdominal hysterectomy and  bilateral salpingo-oophorectomy Wednesday, June 27, at  7:30 a.m. at [facility].      [place] H. Lily Peer, M.D.  Electronically Signed     JHF/MEDQ  D:  03/04/2006  T:  03/04/2006  Job:  [id number]

## 2011-01-25 NOTE — H&P (Signed)
[facility]  Patient:    [name], [name]                  MRN: [id number] Adm. Date:  04/18/00 Attending:  Tonye Royalty                         History and Physical  CHIEF COMPLAINT: Patient with 33 week pregnancy.  HISTORY OF PRESENT ILLNESS: The patient is a 55 year old G2 P1 at 33-5/7th weeks by early first trimester ultrasound.  The patient was seen in the office on April 16, 2000 and noted to have worsening problems on ultrasound with her polyhydramnios.  She refused genetic amniocentesis and MSAFP at 16 weeks. After her 20 weeks scan she was noted to develop polyhydramnios.  The patient has been repeatedly scanned every four weeks and noted to have increasing levels of fluid and developed anomalies which were seen including heart wall defects, craniofacial anomalies, absence of stomach, and crossing over of hands on ultrasound.  The patient was measured at 33 weeks and her growth was noted to be lagging behind at five weeks.  Once the patient was evaluated on April 16, 2000 she was sent to [facility] for emergent consultation to allow the patient to determine a route of care with her pregnancy.  Once consultation occurred at [facility] by Dr. Angelina Sheriff the patient underwent amniocentesis with FISH and noted to have a trisomy-18.  The patient has had a previous abdominal myomectomy and due to her enlarging uterine size the patient would require cesarean section for this pregnancy.  Dr. Sheran Spine recommendations were to perform the cesarean section as soon as possible secondary to poor outcome of the fetus and maternal indications to prevent uterine rupture and preterm labor.  The patient also had positive maturity on her LS ratio at [facility].  PAST MEDICAL HISTORY: None.  PAST SURGICAL HISTORY:  1. Abdominal myomectomy in 1998.  2. Cesarean section, low transverse, in 1999.  PAST OBSTETRICAL HISTORY: Term pregnancy in 1999  with cesarean section due to previous abdominal wall surgery on uterus; 6 pound 5 ounce female without any complications during pregnancy.  PAST GYN HISTORY: Menarche at age 89.  Normal periods.  The patient denies any history of sexually transmitted disease.  MEDICATIONS: Prenatal vitamin.  ALLERGIES:  1. AMOXICILLIN.  2. CODEINE.  SOCIAL HISTORY: The patient is currently married.  She denies any tobacco, alcohol, or drug use.  FAMILY HISTORY: Denies any epithelial cancers.  PHYSICAL EXAMINATION:  VITAL SIGNS: Blood pressure 112/70.  HEENT: Throat clear.  NECK: No thyromegaly or JVD.  LUNGS: Clear to auscultation bilaterally.  HEART: Regular rate and rhythm without any murmurs, rubs, or gallops.  ABDOMEN: Distended.  Abdominal length is 44 cm.  EXTREMITIES: Without clubbing, cyanosis, edema, or tenderness.  PELVIC: Cervix closed and thick.  LABORATORY DATA: Ultrasound at 33 weeks showing IUGR at 28 weeks with polyhydramnios at 38 cm.  Previous laboratories showed hemoglobin of 13, platelets 265,000.  The patient is blood type A-positive.  Antibody negative.  RPR, rubella, hepatitis, and HIV all negative.  The patient refused MSAFP and was repeatedly counseled on having these options done.  Her one hour glucose was 90.  ASSESSMENT/PLAN: This patient is a 55 year old gravida 2 para 1 at 46 weeks with trisomy-18, with previous abdominal myomectomy and cesarean section.  The patient will be scheduled for repeat cesarean section secondary to previous uterine surgery and trisomy of the  fetus.  The patient has extensively been counseled by both [facility] and [facility] about the expectancy of the fetus and the risks associated with the surgery.  The patient was explained the risks including bleeding, transfusion, infection, scar tissue, wound breakdown, damage to internal organs, fistula formation, anesthetic complications, blood clot, stroke, pulmonary  embolism, death, cesarean hysterectomy, and possibility of human immunodeficiency virus transmission with transfusion is 1 in 250,000 and hepatitis 1 in 100,000.  The patient states she understands these risks and understands the reasons for proceeding with the cesarean section early secondary to increased risk of preterm labor, uterine rupture, and the poor outcome of the fetus with the trisomy-18.  The patient will come in for cesarean section on April 21, 2000 at 1 p.m. DD:  04/18/00 TD:  04/19/00 Job: [id number]

## 2011-01-25 NOTE — Op Note (Signed)
   NAMEMARYLAN, GLORE                        ACCOUNT NO.:  [id number]   MEDICAL RECORD NO.:  [id number]                   PATIENT TYPE:  AMB   LOCATION:  DSC                                  FACILITY:  [facility]   PHYSICIAN:  Lowell Bouton, M.D.      DATE OF BIRTH:  05/26/1956   DATE OF PROCEDURE:  03/23/2003  DATE OF DISCHARGE:                                 OPERATIVE REPORT   PREOPERATIVE DIAGNOSIS:  Right carpal tunnel syndrome.   POSTOPERATIVE DIAGNOSIS:  Right carpal tunnel syndrome.   PROCEDURE:  Decompression median nerve right carpal tunnel.   SURGEON:  Lowell Bouton, M.D.   ANESTHESIA:  0.5% Marcaine local with sedation.   FINDINGS:  The patient had no masses in the carpal canal. The motor branch  of the nerve was intact.   DESCRIPTION OF PROCEDURE:  Under 0.5% Marcaine local anesthesia with a  tourniquet on the right arm, the right hand was prepped and draped in the  usual fashion and after exsanguinating the limb, the tourniquet was inflated  to 250 mmHg.  A 3 cm longitudinal incision was made in the palm just ulnar  to the thenar crease.  Sharp dissection was carried through the subcutaneous  tissues and bleeding points were coagulated.  Blunt dissection was carried  through the superficial palmar fascia distal to the transverse carpal  ligament. A hemostat was then placed in the carpal canal up against the hook  of the hamate and the transverse carpal ligament was divided on the ulnar  border of the median nerve. The proximal end of the ligament was divided  with the scissors after dissecting the nerve away from the undersurface of  the ligament.  The carpal canal was then palpated and was found to be  adequately decompressed. The nerve was examined and the motor branch  identified. The carpal canal was explored and there were no masses  identified. The wound was then irrigated with saline. The skin was closed  with 4-0 nylon sutures.  A  sterile dressing was applied followed by a volar  wrist splint. The patient tolerated the procedure well and went to the  recovery room awake, stable, and in good condition.                                               Lowell Bouton, M.D.    EMM/MEDQ  D:  03/23/2003  T:  03/23/2003  Job:  [id number]

## 2011-05-17 ENCOUNTER — Encounter: Payer: Self-pay | Admitting: *Deleted

## 2011-05-17 DIAGNOSIS — F439 Reaction to severe stress, unspecified: Secondary | ICD-10-CM | POA: Insufficient documentation

## 2011-05-21 ENCOUNTER — Encounter: Payer: Self-pay | Admitting: Nurse Practitioner

## 2011-05-21 ENCOUNTER — Ambulatory Visit (INDEPENDENT_AMBULATORY_CARE_PROVIDER_SITE_OTHER): Payer: BC Managed Care – PPO | Admitting: Nurse Practitioner

## 2011-05-21 VITALS — BP 138/80 | HR 62 | Ht 61.5 in | Wt 123.0 lb

## 2011-05-21 DIAGNOSIS — F439 Reaction to severe stress, unspecified: Secondary | ICD-10-CM

## 2011-05-21 DIAGNOSIS — I10 Essential (primary) hypertension: Secondary | ICD-10-CM | POA: Insufficient documentation

## 2011-05-21 DIAGNOSIS — Z733 Stress, not elsewhere classified: Secondary | ICD-10-CM

## 2011-05-21 DIAGNOSIS — E785 Hyperlipidemia, unspecified: Secondary | ICD-10-CM

## 2011-05-21 HISTORY — DX: Essential (primary) hypertension: I10

## 2011-05-21 NOTE — Progress Notes (Signed)
    [name] Date of Birth: [birth year]   History of Present Illness: [name] is seen today for a 6 month check. She is seen for Dr. Patty Sermons. She has done ok from our standpoint. Her husband died earlier this year. She continues to have her struggles in that regards. She is back working some. She admits that she is depressed but does not want to take any medicines. She has no chest pain. She has not been taking her Crestor regularly. Blood pressure has been ok.   Current Outpatient Prescriptions on File Prior to Visit  Medication Sig Dispense Refill  . aspirin 81 MG tablet Take 81 mg by mouth as needed.       Marland Kitchen CALCIUM PO Take by mouth daily.        . Lansoprazole (PREVACID PO) Take by mouth as needed.       . Melatonin 3 MG CAPS Take by mouth daily.        . nebivolol (BYSTOLIC) 5 MG tablet Take 5 mg by mouth daily.        . rosuvastatin (CRESTOR) 5 MG tablet Take 5 mg by mouth every other day.         Allergies  Allergen Reactions  . Amoxicillin   . Codeine     nausea    Past Medical History  Diagnosis Date  . Hypertension   . Hyperlipidemia   . Stress     situational    Past Surgical History  Procedure Date  . Abdominal hysterectomy   . Myomectomy   . Other surgical history     c section x 2  . Carpal tunnel release 2004    History  Smoking status  . Never Smoker   Smokeless tobacco  . Not on file    History  Alcohol Use No    Family History  Problem Relation Age of Onset  . Hypertension Mother   . Hypertension Father     Review of Systems: The review of systems is positive for depression. She has had some swelling of her hands with the hot weather that seems to be getting better.  All other systems were reviewed and are negative.  Physical Exam: BP 138/80  Pulse 62  Ht 5' 1.5" (1.562 m)  Wt 123 lb (55.792 kg)  BMI 22.86 kg/m2 Patient is very pleasant and in no acute distress. Skin is warm and dry. Color is normal.  HEENT is unremarkable.  Normocephalic/atraumatic. PERRL. Sclera are nonicteric. Neck is supple. No masses. No JVD. Lungs are clear. Cardiac exam shows a regular rate and rhythm. Abdomen is soft. Extremities are without edema. Gait and ROM are intact. No gross neurologic deficits noted.  LABORATORY DATA:   Assessment / Plan:

## 2011-05-21 NOTE — Patient Instructions (Signed)
We are going to check your labs today I want to see you in about 3 months Call me if you decide you want to start a low dose antidepressant.

## 2011-05-21 NOTE — Assessment & Plan Note (Signed)
Blood pressure has been ok. No change in her medicines.

## 2011-05-21 NOTE — Assessment & Plan Note (Signed)
We had a good talk. I encouraged her to "be good to herself". I offered her antidepressants, but she wants to hold off. I will see her back in 3 months. Patient is agreeable to this plan and will call if any problems develop in the interim.

## 2011-05-21 NOTE — Assessment & Plan Note (Signed)
Labs are checked today. She is now back on her Crestor.

## 2011-05-22 LAB — BASIC METABOLIC PANEL
BUN: 11 mg/dL (ref 6–23)
CO2: 30 mEq/L (ref 19–32)
Calcium: 9.4 mg/dL (ref 8.4–10.5)
Chloride: 104 mEq/L (ref 96–112)
Creatinine, Ser: 0.6 mg/dL (ref 0.4–1.2)
GFR: 108.54 mL/min (ref >60.00)
Glucose, Bld: 84 mg/dL (ref 70–99)
Potassium: 4.9 mEq/L (ref 3.5–5.1)
Sodium: 142 mEq/L (ref 135–145)

## 2011-05-22 LAB — HEPATIC FUNCTION PANEL
ALT: 28 U/L (ref 0–35)
AST: 25 U/L (ref 0–37)
Albumin: 4.2 g/dL (ref 3.5–5.2)
Alkaline Phosphatase: 86 U/L (ref 39–117)
Bilirubin, Direct: 0.1 mg/dL (ref 0.0–0.3)
Total Bilirubin: 0.7 mg/dL (ref 0.3–1.2)
Total Protein: 6.3 g/dL (ref 6.0–8.3)

## 2011-05-22 LAB — LIPID PANEL
Cholesterol: 189 mg/dL (ref 0–200)
HDL: 58.4 mg/dL (ref >39.00)
LDL Cholesterol: 113 mg/dL — ABNORMAL HIGH (ref 0–99)
Total CHOL/HDL Ratio: 3
Triglycerides: 90 mg/dL (ref 0.0–149.0)
VLDL: 18 mg/dL (ref 0.0–40.0)

## 2011-05-22 LAB — TSH: TSH: 1.54 u[IU]/mL (ref 0.35–5.50)

## 2011-05-23 NOTE — Progress Notes (Signed)
lm

## 2011-05-30 ENCOUNTER — Telehealth: Payer: Self-pay | Admitting: *Deleted

## 2011-05-30 NOTE — Telephone Encounter (Signed)
Message copied by Lorayne Bender on Thu May 30, 2011  4:07 PM ------      Message from: Rosalio Macadamia      Created: Wed May 22, 2011  3:53 PM       Ok to report. Labs are satisfactory.  Stay on same medicines. Recheck BMET/HPF/LIPIDS  in 6 months.

## 2011-05-30 NOTE — Telephone Encounter (Signed)
Lm w/results. Will send copy of labs. Sent copy to Dr. Laury Axon

## 2011-06-03 ENCOUNTER — Other Ambulatory Visit: Payer: Self-pay

## 2011-06-05 ENCOUNTER — Telehealth: Payer: Self-pay | Admitting: Nurse Practitioner

## 2011-06-05 MED ORDER — SERTRALINE HCL 50 MG PO TABS: 50.0000 mg | ORAL_TABLET | Freq: Every day | ORAL | Status: DC

## 2011-06-05 NOTE — Telephone Encounter (Signed)
Pt called. She said she saw [name] recently wants rx for anti depressant. Doesn't want med to take all the time and she doesn't want effexor she uses [facility] at [place] [phone]

## 2011-06-05 NOTE — Telephone Encounter (Signed)
Called requesting anti depressant that [name] had suggested she take on her last visit. States "I'm just not coping well and can't sleep". [name] will start her on Zoloft 50 mg. Instructions to be 1/2 tablet daily x 2 wks then increase to 50 mg. She can decide if she wants to take in AM or PM. Advised may cause some drowsiness and some queasiness. Also advised if she has any side effects to call us and not to stop suddenly. Will have to wean her off. Will call into [facility] [place]. She will notify us immediately if develops any side effects.

## 2011-08-19 ENCOUNTER — Other Ambulatory Visit: Payer: Self-pay | Admitting: Nurse Practitioner

## 2011-09-06 ENCOUNTER — Telehealth: Payer: Self-pay | Admitting: Cardiology

## 2011-09-06 DIAGNOSIS — I119 Hypertensive heart disease without heart failure: Secondary | ICD-10-CM

## 2011-09-06 MED ORDER — METOPROLOL SUCCINATE ER 25 MG PO TB24: 25.0000 mg | ORAL_TABLET | Freq: Every day | ORAL | Status: DC

## 2011-09-06 NOTE — Telephone Encounter (Signed)
Stop bystolic and start Toprol XL 25 mg one daily and see if symptoms improve.

## 2011-09-06 NOTE — Telephone Encounter (Signed)
Was put on this in April and this all startle in May.  Has gotten worse, to the point that she has a hard time bending them.  Did look on the computer and this was a possibility.  Has had carpal tunnel and this does not feel like it at all

## 2011-09-06 NOTE — Telephone Encounter (Signed)
New Msg: Pt calling stating that she was recently put on bystolic and pt wants to know if she can be switch to an alternate medication. Pt c/o pain, tingling, and stiffness in both hands. Pt thinks bystolic might be the cause of the stiffness. Pt wants to switch to another medication in hopes that negative symptoms might subside. Please return pt call to discuss further.

## 2011-09-06 NOTE — Telephone Encounter (Signed)
Advised patient

## 2011-10-15 ENCOUNTER — Ambulatory Visit (INDEPENDENT_AMBULATORY_CARE_PROVIDER_SITE_OTHER): Payer: BC Managed Care – PPO | Admitting: Cardiology

## 2011-10-15 ENCOUNTER — Encounter: Payer: Self-pay | Admitting: Cardiology

## 2011-10-15 VITALS — BP 132/78 | HR 88 | Resp 18 | Ht 61.0 in | Wt 126.1 lb

## 2011-10-15 DIAGNOSIS — M79609 Pain in unspecified limb: Secondary | ICD-10-CM

## 2011-10-15 DIAGNOSIS — M79642 Pain in left hand: Secondary | ICD-10-CM | POA: Insufficient documentation

## 2011-10-15 DIAGNOSIS — Z733 Stress, not elsewhere classified: Secondary | ICD-10-CM

## 2011-10-15 DIAGNOSIS — M79645 Pain in left finger(s): Secondary | ICD-10-CM

## 2011-10-15 DIAGNOSIS — I119 Hypertensive heart disease without heart failure: Secondary | ICD-10-CM

## 2011-10-15 DIAGNOSIS — E785 Hyperlipidemia, unspecified: Secondary | ICD-10-CM

## 2011-10-15 DIAGNOSIS — F439 Reaction to severe stress, unspecified: Secondary | ICD-10-CM

## 2011-10-15 NOTE — Assessment & Plan Note (Signed)
On her own she stopped taking her Crestor.  We will plan to check her lipid panel at her next visit.  In the meantime she will stay on a careful heart healthy diet.

## 2011-10-15 NOTE — Progress Notes (Signed)
[name] Date of Birth:  04-09-1956 [facility] [address] [place], [place]  [address] [phone]  Fax   [phone]  HPI: This pleasant 56 year old recently widowed Caucasian female is seen after a long absence.  She has a past history of hypercholesterolemia and a history of labile hypertension.  She has been under good deal of stress recently.  Her husband died last year after a 22 month battle with pancreatic cancer.  She has had a difficult time handling the stress.  She has a 70 year old son at home.  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  She has been experiencing some atypical chest discomfort and because of that she did have a nuclear stress test on 11/13/10 which was negative for ischemia and showed normal left ventricular function with an ejection fraction of 50%.  Recently she's been experiencing some difficulty with pain in her left arm at night and also pain in her ring finger on the left hand.  At times the finger is difficult to flex and is unusually stiff..  She tried changing her medication around and that has not made any difference.  Eventually she went back to her previous dose of Bystolic 5 mg daily.  She is concerned about her function of her hands because she depends on her hands to make a living as a Interior and spatial designer.  Current Outpatient Prescriptions  Medication Sig Dispense Refill  . nebivolol (BYSTOLIC) 5 MG tablet Take 5 mg by mouth daily.        Allergies  Allergen Reactions  . Amoxicillin   . Codeine     nausea    Patient Active Problem List  Diagnoses  . HYPERLIPIDEMIA  . COLONIC POLYPS, HX OF  . Stress  . HTN (hypertension)    History  Smoking status  . Never Smoker   Smokeless tobacco  . Not on file    History  Alcohol Use No    Family History  Problem Relation Age of Onset  . Hypertension Mother   . Hypertension Father     Review of Systems: The patient denies any heat or cold intolerance.   No weight gain or weight loss.  The patient denies headaches or blurry vision.  There is no cough or sputum production.  The patient denies dizziness.  There is no hematuria or hematochezia.  The patient denies any muscle aches or arthritis.  The patient denies any rash.  The patient denies frequent falling or instability.  There is no history of depression or anxiety.  All other systems were reviewed and are negative.   Physical Exam: Filed Vitals:   10/15/11 1613  BP: 132/78  Pulse: 88  Resp: 18   the general appearance reveals a well-developed well-nourished woman in no distress.The head and neck exam reveals pupils equal and reactive.  Extraocular movements are full.  There is no scleral icterus.  The mouth and pharynx are normal.  The neck is supple.  The carotids reveal no bruits.  The jugular venous pressure is normal.  The  thyroid is not enlarged.  There is no lymphadenopathy.  The chest is clear to percussion and auscultation.  There are no rales or rhonchi.  Expansion of the chest is symmetrical.  The precordium is quiet.  The first heart sound is normal.  The second heart sound is physiologically split.  There is no murmur gallop rub or click.  There is no abnormal lift or heave.  The abdomen is soft and  nontender.  The bowel sounds are normal.  The liver and spleen are not enlarged.  There are no abdominal masses.  There are no abdominal bruits.  Extremities reveal good pedal pulses.  There is no phlebitis or edema.  There is no cyanosis or clubbing.  Strength is normal and symmetrical in all extremities.  There is no lateralizing weakness.  There are no sensory deficits.  The skin is warm and dry.  There is no rash.  The left hand reveals limitation of motion of the ring finger which appears to be slightly swollen.      Assessment / Plan: Continue present medication.  This way to the treadmill exercise.  Recheck in 6 months for followup office visit and fasting lab work.

## 2011-10-15 NOTE — Assessment & Plan Note (Signed)
We will refer her to Dr. Teressa Senter   regarding her left hand predicament.  He is scheduled to see her on February 18 at 2 PM

## 2011-10-15 NOTE — Assessment & Plan Note (Signed)
The patient previously had been very active with physical fitness and exercising regularly.  Since her husband's death she has had a hard time getting motivated to exercise.  This has adversely affected her health and her blood pressure.  She intends to get started on a regular treadmill exercise program again at home.

## 2011-10-15 NOTE — Patient Instructions (Signed)
Appointment with Dr Teressa Senter 10/28/11 at 2:00 Your physician wants you to follow-up in: 6 months You will receive a reminder letter in the mail two months in advance. If you don't receive a letter, please call our office to schedule the follow-up appointment.

## 2012-02-06 ENCOUNTER — Telehealth: Payer: Self-pay | Admitting: *Deleted

## 2012-02-06 ENCOUNTER — Other Ambulatory Visit: Payer: Self-pay | Admitting: Gynecology

## 2012-02-06 NOTE — Telephone Encounter (Signed)
Pt calling requesting order for diag. Mammogram order due to breast pain, pt informed OV with JF first.

## 2012-03-02 ENCOUNTER — Other Ambulatory Visit: Payer: Self-pay | Admitting: Cardiology

## 2012-03-02 MED ORDER — NEBIVOLOL HCL 5 MG PO TABS: 5.0000 mg | ORAL_TABLET | Freq: Every day | ORAL | Status: DC

## 2012-03-02 NOTE — Telephone Encounter (Signed)
Refilled bystolic 

## 2012-03-11 ENCOUNTER — Telehealth: Payer: Self-pay | Admitting: *Deleted

## 2012-03-11 ENCOUNTER — Ambulatory Visit (INDEPENDENT_AMBULATORY_CARE_PROVIDER_SITE_OTHER): Payer: BC Managed Care – PPO | Admitting: Gynecology

## 2012-03-11 ENCOUNTER — Encounter: Payer: Self-pay | Admitting: Gynecology

## 2012-03-11 ENCOUNTER — Other Ambulatory Visit: Payer: Self-pay | Admitting: *Deleted

## 2012-03-11 VITALS — BP 134/84 | Ht 61.5 in | Wt 129.5 lb

## 2012-03-11 DIAGNOSIS — Z780 Asymptomatic menopausal state: Secondary | ICD-10-CM | POA: Insufficient documentation

## 2012-03-11 DIAGNOSIS — Z01419 Encounter for gynecological examination (general) (routine) without abnormal findings: Secondary | ICD-10-CM

## 2012-03-11 DIAGNOSIS — M79629 Pain in unspecified upper arm: Secondary | ICD-10-CM

## 2012-03-11 DIAGNOSIS — R5381 Other malaise: Secondary | ICD-10-CM

## 2012-03-11 DIAGNOSIS — R5383 Other fatigue: Secondary | ICD-10-CM

## 2012-03-11 DIAGNOSIS — N644 Mastodynia: Secondary | ICD-10-CM

## 2012-03-11 HISTORY — DX: Other fatigue: R53.83

## 2012-03-11 HISTORY — DX: Asymptomatic menopausal state: Z78.0

## 2012-03-11 LAB — CBC WITH DIFFERENTIAL/PLATELET
Basophils Absolute: 0 10*3/uL (ref 0.0–0.1)
Basophils Relative: 0 % (ref 0–1)
Eosinophils Absolute: 0.2 10*3/uL (ref 0.0–0.7)
Eosinophils Relative: 3 % (ref 0–5)
MCH: 30.8 pg (ref 26.0–34.0)
MCV: 89.4 fL (ref 78.0–100.0)
Platelets: 274 10*3/uL (ref 150–400)
RDW: 13.2 % (ref 11.5–15.5)
WBC: 6.9 10*3/uL (ref 4.0–10.5)

## 2012-03-11 NOTE — Telephone Encounter (Signed)
Message copied by Libby Maw on Wed Mar 11, 2012  2:37 PM ------      Message from: Ok Edwards      Created: Wed Mar 11, 2012 12:47 PM       Lenward Able, please schedule bilateral diagnostic mammogram on this patient with bilateral axillary pains.

## 2012-03-11 NOTE — Progress Notes (Signed)
[name] 18-Jul-1956 [id number]   History:    56 y.o.  for annual gyn exam who has not been seen the office in a few years. Patient loss her husband last year secondary to pancreatic cancer. Patient with prior history of total abdominal hysterectomy with bilateral salpingo-oophorectomy. In the past she had been placed on transdermal estrogen for vasomotor symptoms and she discontinued it a year and a half ago and has had no vasomotor symptoms complaints. Review of her record indicated her colonoscopy was in 2009 and recommendation was to follow up in 10 years. She is taking her calcium and vitamin D daily. Her last mammogram was in April 2011. Patient has been complaining of bilateral axillary tenderness but no true discernible mass was reported to be palpated or any galactorrhea. Patient's last bone density study was in 2007 which was normal. She was recently diagnosed with rheumatoid arthritis. She's change her primary physician and we'll start seeing a new family physician Dr. Selena Batten in the fall. She is also been followed by Dr. Patty Sermons (cardiologist) for labile hypertension and hypercholesterolemia and atypical chest pain. Patien has t been complaining of tiredness and fatigue but no shortness of breath or timing of chest or palpitations recently.  Past medical history,surgical history, family history and social history were all reviewed and documented in the EPIC chart.  Gynecologic History No LMP recorded. Patient has had a hysterectomy. Contraception: none and Prior hysterectomy Last Pap: 2011. Results were: normal Last mammogram: 2011. Results were: normal  Obstetric History OB History    Grav Para Term Preterm Abortions TAB SAB Ect Mult Living   4 1 1  3  3   1      # Outc Date GA Lbr Len/2nd Wgt Sex Del Anes PTL Lv   1 TRM     M SVD  No Yes   2 SAB            3 SAB            4 SAB                ROS: A ROS was performed and pertinent positives and negatives are included in  the history.  GENERAL: No fevers or chills. HEENT: No change in vision, no earache, sore throat or sinus congestion. NECK: No pain or stiffness. CARDIOVASCULAR: No chest pain or pressure. No palpitations. PULMONARY: No shortness of breath, cough or wheeze. GASTROINTESTINAL: No abdominal pain, nausea, vomiting or diarrhea, melena or bright red blood per rectum. GENITOURINARY: No urinary frequency, urgency, hesitancy or dysuria. MUSCULOSKELETAL: No joint or muscle pain, no back pain, no recent trauma. DERMATOLOGIC: No rash, no itching, no lesions. ENDOCRINE: No polyuria, polydipsia, no heat or cold intolerance. No recent change in weight. HEMATOLOGICAL: No anemia or easy bruising or bleeding. NEUROLOGIC: No headache, seizures, numbness, tingling or weakness. PSYCHIATRIC: No depression, no loss of interest in normal activity or change in sleep pattern.     Exam: chaperone present  BP 134/84  Ht 5' 1.5" (1.562 m)  Wt 129 lb 8 oz (58.741 kg)  BMI 24.07 kg/m2  Body mass index is 24.07 kg/(m^2).  General appearance : Well developed well nourished female. No acute distress HEENT: Neck supple, trachea midline, no carotid bruits, no thyroidmegaly Lungs: Clear to auscultation, no rhonchi or wheezes, or rib retractions  Heart: Regular rate and rhythm, no murmurs or gallops Breast:Examined in sitting and supine position were symmetrical in appearance, no palpable masses or tenderness,  no skin  retraction, no nipple inversion, no nipple discharge, no skin discoloration, no axillary or supraclavicular lymphadenopathy Abdomen: no palpable masses or tenderness, no rebound or guarding Extremities: no edema or skin discoloration or tenderness  Pelvic:  Bartholin, Urethra, Skene Glands: Within normal limits             Vagina: No gross lesions or discharge  Cervix: Absent Uterus  absent  Adnexa  Without masses or tenderness  Anus and perineum  normal   Rectovaginal  normal sphincter tone without palpated  masses or tenderness             Hemoccult cards provided     Assessment/Plan:  56 y.o. female for annual exam who appears to be suffering from depression. She does not like taking the medication. She has good family support. I've given the name of one of our local psychiatrist for her to followup as a therapist. Patient with no known prior history of abnormal  Pap smears so she will no longer need them and also since she has had a hysterectomy. (New guidelines). She will schedule a bone density study here in the office. She was encouraged to submit to the office the Hemoccult cards for testing. We'll schedule bilateral diagnostic mammogram because of her complaints of bilateral axillary tenderness. No masses were palpated today. The following labs were drawn today: CBC, TSH, random blood sugar, urinalysis, and vitamin D level. She'll continue to followup with a cardiologist was monitoring her hypertension and hypercholesterolemia. We discussed importance of regular exercise. We will see her back in one year or when necessary.    Ok Edwards MD, 2:00 PM 03/11/2012

## 2012-03-11 NOTE — Telephone Encounter (Signed)
Patient informed appt set at [facility] on 03/19/12 @9am 

## 2012-03-11 NOTE — Patient Instructions (Addendum)

## 2012-03-12 LAB — URINALYSIS W MICROSCOPIC + REFLEX CULTURE
Bacteria, UA: NONE SEEN
Bilirubin Urine: NEGATIVE
Casts: NONE SEEN
Crystals: NONE SEEN
Ketones, ur: NEGATIVE mg/dL
Specific Gravity, Urine: 1.007 (ref 1.005–1.030)
Urobilinogen, UA: 0.2 mg/dL (ref 0.0–1.0)

## 2012-03-19 ENCOUNTER — Other Ambulatory Visit: Payer: BC Managed Care – PPO

## 2012-03-27 ENCOUNTER — Ambulatory Visit
Admission: RE | Admit: 2012-03-27 | Discharge: 2012-03-27 | Disposition: A | Discharge status: Home / Self Care | Payer: BC Managed Care – PPO | Source: Ambulatory Visit | Attending: Gynecology | Admitting: Gynecology

## 2012-03-27 ENCOUNTER — Other Ambulatory Visit: Payer: Self-pay | Admitting: Gynecology

## 2012-03-27 DIAGNOSIS — M79629 Pain in unspecified upper arm: Secondary | ICD-10-CM

## 2012-04-03 ENCOUNTER — Telehealth: Payer: Self-pay | Admitting: *Deleted

## 2012-04-03 NOTE — Telephone Encounter (Signed)
Pt informed of recent lab results 03/11/12 OV. All normal.

## 2012-05-05 ENCOUNTER — Ambulatory Visit (INDEPENDENT_AMBULATORY_CARE_PROVIDER_SITE_OTHER): Payer: BC Managed Care – PPO | Admitting: Cardiology

## 2012-05-05 ENCOUNTER — Encounter: Payer: Self-pay | Admitting: Cardiology

## 2012-05-05 VITALS — BP 112/72 | HR 70 | Ht 61.5 in | Wt 126.6 lb

## 2012-05-05 DIAGNOSIS — I10 Essential (primary) hypertension: Secondary | ICD-10-CM

## 2012-05-05 DIAGNOSIS — I119 Hypertensive heart disease without heart failure: Secondary | ICD-10-CM

## 2012-05-05 MED ORDER — NEBIVOLOL HCL 2.5 MG PO TABS: 2.5000 mg | ORAL_TABLET | Freq: Every day | ORAL | Status: DC

## 2012-05-05 NOTE — Patient Instructions (Signed)
Decrease your Bystolic to 2.5 mg daily, will send new Rx to pharmacy  Your physician recommends that you schedule a follow-up appointment in: 3 months

## 2012-05-05 NOTE — Assessment & Plan Note (Signed)
The patient is concerned that the nocturnal symptoms with her hands may be related to her blood pressure medication.  Since her blood pressure is now better controlled we will cut back on the dose down to 2.5 mg daily and observe response.

## 2012-05-05 NOTE — Progress Notes (Signed)
[name] Date of Birth:  05/08/1956 [facility] [address] [place], [place]  [address] [phone]  Fax   [phone]  HPI: This pleasant 56 year old woman is seen for a followup office visit.  He has a past history of mild essential hypertension and has been on  bystolic 5 mg a day with good control of her blood pressure.  She's been having problems with stiffness of her hands at night and swelling of her hands.  She has seen Dr. Margaree Mackintosh and is now seeing a rheumatologist.  The question of possible rheumatoid arthritis has been raised although some of the blood tests are apparently equivocal in this regard.  The patient has not been having any new cardiac symptoms.  Over the past year she has been under a great deal of stress following her husband's death from pancreatic cancer.  The patient is gradually coming out of the grief.  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  She's also the single mom of a 36 year old boy who is entering the eighth grade.  Fortunately he is an Human resources officer and also a excellent juggler.  Current Outpatient Prescriptions  Medication Sig Dispense Refill  . aspirin 81 MG tablet Take 81 mg by mouth daily.      . Calcium Carbonate-Vitamin D (CALCIUM + D PO) Take by mouth daily.      . Multiple Vitamin (MULTIVITAMIN) tablet Take 1 tablet by mouth daily.      . nebivolol (BYSTOLIC) 2.5 MG tablet Take 1 tablet (2.5 mg total) by mouth daily.  30 tablet  11  . DISCONTD: nebivolol (BYSTOLIC) 5 MG tablet Take 1 tablet (5 mg total) by mouth daily.  30 tablet  11    Allergies  Allergen Reactions  . Amoxicillin   . Codeine     nausea  . Megace (Megestrol)   . Morphine And Related     Patient Active Problem List  Diagnosis  . HYPERLIPIDEMIA  . COLONIC POLYPS, HX OF  . Stress  . HTN (hypertension)  . Pain of left hand  . Menopause  . Tiredness    History  Smoking status  . Never Smoker   Smokeless tobacco    . Never Used    History  Alcohol Use No    Family History  Problem Relation Age of Onset  . Hypertension Mother   . Hypertension Father   . Cancer Father     LUNG    Review of Systems: The patient denies any heat or cold intolerance.  No weight gain or weight loss.  The patient denies headaches or blurry vision.  There is no cough or sputum production.  The patient denies dizziness.  There is no hematuria or hematochezia.  The patient denies any muscle aches or arthritis.  The patient denies any rash.  The patient denies frequent falling or instability.  There is no history of depression or anxiety.  All other systems were reviewed and are negative.   Physical Exam: Filed Vitals:   05/05/12 1023  BP: 112/72  Pulse: 70   the general appearance reveals a well-developed well-nourished woman in no distress.The head and neck exam reveals pupils equal and reactive.  Extraocular movements are full.  There is no scleral icterus.  The mouth and pharynx are normal.  The neck is supple.  The carotids reveal no bruits.  The jugular venous pressure is normal.  The  thyroid is not enlarged.  There is no lymphadenopathy.  The chest is clear to percussion and auscultation.  There are no rales or rhonchi.  Expansion of the chest is symmetrical.  The precordium is quiet.  The first heart sound is normal.  The second heart sound is physiologically split.  There is no murmur gallop rub or click.  There is no abnormal lift or heave.  The abdomen is soft and nontender.  The bowel sounds are normal.  The liver and spleen are not enlarged.  There are no abdominal masses.  There are no abdominal bruits.  Extremities reveal good pedal pulses.  There is no phlebitis or edema.  There is no cyanosis or clubbing.  Strength is normal and symmetrical in all extremities.  There is no lateralizing weakness.  There are no sensory deficits.  The skin is warm and dry.  There is no rash.      Assessment / Plan: Continue  present medication except decrease Bystolic 2.5 mg daily. Recheck in 3 months for followup office visit.  Observe response to the lower dose of medication.  Continue on a low-salt diet and continue regular aerobic exercise.

## 2012-08-10 ENCOUNTER — Ambulatory Visit (INDEPENDENT_AMBULATORY_CARE_PROVIDER_SITE_OTHER): Payer: BC Managed Care – PPO | Admitting: Cardiology

## 2012-08-10 ENCOUNTER — Encounter: Payer: Self-pay | Admitting: Cardiology

## 2012-08-10 VITALS — BP 140/76 | HR 72 | Ht 61.5 in | Wt 131.4 lb

## 2012-08-10 DIAGNOSIS — R5381 Other malaise: Secondary | ICD-10-CM

## 2012-08-10 DIAGNOSIS — I10 Essential (primary) hypertension: Secondary | ICD-10-CM

## 2012-08-10 DIAGNOSIS — R5383 Other fatigue: Secondary | ICD-10-CM

## 2012-08-10 DIAGNOSIS — E785 Hyperlipidemia, unspecified: Secondary | ICD-10-CM

## 2012-08-10 DIAGNOSIS — I119 Hypertensive heart disease without heart failure: Secondary | ICD-10-CM

## 2012-08-10 NOTE — Assessment & Plan Note (Signed)
Her blood pressure is a little higher today.  She has felt a little more stressed recently because of concern over her father's state of health.  He has had a past history of lung cancer.  She recently carried her mother and father down to the beach for a Thanksgiving vacation.

## 2012-08-10 NOTE — Assessment & Plan Note (Signed)
The patient keeps a very full schedule as a single mother and working full-time as a Interior and spatial designer.  She also does do yard work.  More recently she has been more involved in the care of her mother and father

## 2012-08-10 NOTE — Assessment & Plan Note (Signed)
Patient has a past history of hyperlipidemia controlled with careful diet

## 2012-08-10 NOTE — Progress Notes (Signed)
[name] Date of Birth:  11-30-1955 [facility] [address] [place], [place]  [address] [phone]  Fax   [phone]  HPI: This pleasant 56 year old woman is seen for a followup office visit. He has a past history of mild essential hypertension and has been on bystolic 2.5 mg a day with good control of her blood pressure.  She has had a past history of stiffness and discomfort in her hands and has seen Dr. Teressa Senter.  She is a recent widow, her husband having died of pancreatic cancer several years ago.   Current Outpatient Prescriptions  Medication Sig Dispense Refill  . aspirin 81 MG tablet Take 81 mg by mouth daily.      . Calcium Carbonate-Vitamin D (CALCIUM + D PO) Take by mouth daily.      . Multiple Vitamin (MULTIVITAMIN) tablet Take 1 tablet by mouth daily.      . nebivolol (BYSTOLIC) 2.5 MG tablet Take 1 tablet (2.5 mg total) by mouth daily.  30 tablet  11    Allergies  Allergen Reactions  . Amoxicillin   . Codeine     nausea  . Megace (Megestrol)   . Morphine And Related     Patient Active Problem List  Diagnosis  . HYPERLIPIDEMIA  . COLONIC POLYPS, HX OF  . Stress  . HTN (hypertension)  . Pain of left hand  . Menopause  . Tiredness    History  Smoking status  . Never Smoker   Smokeless tobacco  . Never Used    History  Alcohol Use No    Family History  Problem Relation Age of Onset  . Hypertension Mother   . Hypertension Father   . Cancer Father     LUNG    Review of Systems: The patient denies any heat or cold intolerance.  No weight gain or weight loss.  The patient denies headaches or blurry vision.  There is no cough or sputum production.  The patient denies dizziness.  There is no hematuria or hematochezia.  The patient denies any muscle aches or arthritis.  The patient denies any rash.  The patient denies frequent falling or instability.  There is no history of depression or anxiety.  All other systems were  reviewed and are negative.   Physical Exam: Filed Vitals:   08/10/12 1011  BP: 140/76  Pulse: 72   The general appearance reveals a well-developed well-nourished woman in no distress.The head and neck exam reveals pupils equal and reactive.  Extraocular movements are full.  There is no scleral icterus.  The mouth and pharynx are normal.  The neck is supple.  The carotids reveal no bruits.  The jugular venous pressure is normal.  The  thyroid is not enlarged.  There is no lymphadenopathy.  The chest is clear to percussion and auscultation.  There are no rales or rhonchi.  Expansion of the chest is symmetrical.  The precordium is quiet.  The first heart sound is normal.  The second heart sound is physiologically split.  There is no murmur gallop rub or click.  There is no abnormal lift or heave.  The abdomen is soft and nontender.  The bowel sounds are normal.  The liver and spleen are not enlarged.  There are no abdominal masses.  There are no abdominal bruits.  Extremities reveal good pedal pulses.  There is no phlebitis or edema.  There is no cyanosis or clubbing.  Strength is normal and symmetrical in  all extremities.  There is no lateralizing weakness.  There are no sensory deficits.  The skin is warm and dry.  There is no rash.     Assessment / Plan:  continue same medication she is doing well on a smaller dose of Bystolic. Recheck in 6 months for followup office visit.  After that consider a followup lipid panel unless she has had one elsewhere in the meantime.

## 2012-08-10 NOTE — Patient Instructions (Addendum)
Your physician recommends that you continue on your current medications as directed. Please refer to the Current Medication list given to you today.  Your physician wants you to follow-up in: 6 months ov You will receive a reminder letter in the mail two months in advance. If you don't receive a letter, please call our office to schedule the follow-up appointment.  

## 2012-08-19 ENCOUNTER — Other Ambulatory Visit: Payer: Self-pay | Admitting: *Deleted

## 2013-05-11 ENCOUNTER — Other Ambulatory Visit: Payer: Self-pay | Admitting: Cardiology

## 2013-06-11 ENCOUNTER — Other Ambulatory Visit: Payer: Self-pay | Admitting: Cardiology

## 2013-06-15 ENCOUNTER — Ambulatory Visit (INDEPENDENT_AMBULATORY_CARE_PROVIDER_SITE_OTHER): Payer: BC Managed Care – PPO | Admitting: Nurse Practitioner

## 2013-06-15 ENCOUNTER — Encounter: Payer: Self-pay | Admitting: Nurse Practitioner

## 2013-06-15 ENCOUNTER — Telehealth: Payer: Self-pay | Admitting: Nurse Practitioner

## 2013-06-15 VITALS — BP 140/80 | HR 64 | Ht 62.0 in | Wt 134.0 lb

## 2013-06-15 DIAGNOSIS — E785 Hyperlipidemia, unspecified: Secondary | ICD-10-CM

## 2013-06-15 DIAGNOSIS — I10 Essential (primary) hypertension: Secondary | ICD-10-CM

## 2013-06-15 MED ORDER — NEBIVOLOL HCL 2.5 MG PO TABS: 2.5000 mg | ORAL_TABLET | Freq: Every day | ORAL | Status: DC

## 2013-06-15 NOTE — Patient Instructions (Addendum)
Stay on your current medicines - I have refilled the Bystolic today  We need to check labs later this week   See Dr. Patty Sermons in one year  Call the [facility] office at [phone] if you have any questions, problems or concerns.

## 2013-06-15 NOTE — Telephone Encounter (Signed)
New problem    Patient calling back to speak with Danielle.

## 2013-06-15 NOTE — Progress Notes (Signed)
[name] Date of Birth: 02-17-1956 Medical Record #[id number]  History of Present Illness: [name] is seen back today for a follow up visit. It is a 10 month check. Seen for Dr. Patty Le. She has a history of HTN and HLD.   Last seen here in December - was doing ok - has struggled since her husband's death from pancreatic cancer. Now dealing with aging parents who are not well.   Comes in today. Here alone. Needs her medicine refilled. Did not take today. Doing ok for the most part - has good days and bad days still. Tries to put on a "good face". Son now 57 1/2. Has more issues with her eyes - very dry - really does not have tears - some floaters. Wanting to go to the eye doctor. Notes that her BP has been good for the most part - she will note that if she gets anxious she will get short of breath and her BP will be high. Exercising sporadically. No chest pain. No swelling.   Current Outpatient Prescriptions  Medication Sig Dispense Refill  . aspirin 81 MG tablet Take 81 mg by mouth daily.      . Biotin 1 MG CAPS Take by mouth daily.      Marland Kitchen BYSTOLIC 2.5 MG tablet TAKE 1 TABLET BY MOUTH EVERY DAY  30 tablet  0  . Calcium Carbonate-Vitamin D (CALCIUM + D PO) Take by mouth daily.      . Multiple Vitamin (MULTIVITAMIN) tablet Take 1 tablet by mouth daily.       No current facility-administered medications for this visit.    Allergies  Allergen Reactions  . Amoxicillin   . Codeine     nausea  . Megace [Megestrol]   . Morphine And Related     Past Medical History  Diagnosis Date  . Hypertension   . Hyperlipidemia   . Stress     situational  . Trisomy 18     prior pregnancy with Trisomy 18  . SAB (spontaneous abortion)   . Polyp of colon 2009    benign  . Anxiety   . Depression     Past Surgical History  Procedure Laterality Date  . Abdominal hysterectomy  03/04/2006    BSO  . Myomectomy    . Other surgical history      c section x 2  . Carpal tunnel  release  2004    right  . Cesarean section      x 2 ( 1 with trisomy 18)    History  Smoking status  . Never Smoker   Smokeless tobacco  . Never Used    History  Alcohol Use No    Family History  Problem Relation Age of Onset  . Hypertension Mother   . Hypertension Father   . Cancer Father     LUNG    Review of Systems: The review of systems is per the HPI.  All other systems were reviewed and are negative.  Physical Exam: BP 140/80  Pulse 64  Ht 5\' 2"  (1.575 m)  Wt 134 lb (60.782 kg)  BMI 24.5 kg/m2 Patient is very pleasant and in no acute distress. Skin is warm and dry. Color is normal.  HEENT is unremarkable. Normocephalic/atraumatic. PERRL. Sclera are nonicteric. Neck is supple. No masses. No JVD. Lungs are clear. Cardiac exam shows a regular rate and rhythm. Abdomen is soft. Extremities are without edema. Gait and ROM are intact. No  gross neurologic deficits noted.  LABORATORY DATA: PENDING  Lab Results  Component Value Date   WBC 6.9 03/11/2012   HGB 14.5 03/11/2012   HCT 42.1 03/11/2012   PLT 274 03/11/2012   GLUCOSE 90 03/11/2012   CHOL 189 05/21/2011   TRIG 90.0 05/21/2011   HDL 58.40 05/21/2011   LDLCALC 113* 05/21/2011   ALT 28 05/21/2011   AST 25 05/21/2011   NA 142 05/21/2011   K 4.9 05/21/2011   CL 104 05/21/2011   CREATININE 0.6 05/21/2011   BUN 11 05/21/2011   CO2 30 05/21/2011   TSH 2.107 03/11/2012   INR 1.0 02/08/2009     Assessment / Plan: 1. HTN - BP ok - better control at home. I have refilled her medicine. See Dr. Patty Le back in one year. Would consider echo if shortness of breath persists - but this seems anxiety/stress driven.   2. HLD - not fasting today - will schedule for later this week or next.   3. Dry eye/floater - have given her the name of [name] with the phone number.   Will tentatively see her back in a year.   Patient is agreeable to this plan and will call if any problems develop in the interim.   Rosalio Macadamia, RN,  ANP-C [facility] [address] [place], [place]  [address]

## 2013-06-21 ENCOUNTER — Other Ambulatory Visit (INDEPENDENT_AMBULATORY_CARE_PROVIDER_SITE_OTHER): Payer: BC Managed Care – PPO

## 2013-06-21 DIAGNOSIS — E785 Hyperlipidemia, unspecified: Secondary | ICD-10-CM

## 2013-06-21 DIAGNOSIS — I10 Essential (primary) hypertension: Secondary | ICD-10-CM

## 2013-06-21 LAB — BASIC METABOLIC PANEL
BUN: 11 mg/dL (ref 6–23)
CO2: 28 mEq/L (ref 19–32)
Calcium: 9 mg/dL (ref 8.4–10.5)
Chloride: 104 mEq/L (ref 96–112)
Creatinine, Ser: 0.6 mg/dL (ref 0.4–1.2)
GFR: 101.91 mL/min (ref >60.00)
Glucose, Bld: 86 mg/dL (ref 70–99)
Potassium: 4 mEq/L (ref 3.5–5.1)
Sodium: 141 mEq/L (ref 135–145)

## 2013-06-21 LAB — HEPATIC FUNCTION PANEL
ALT: 19 U/L (ref 0–35)
AST: 19 U/L (ref 0–37)
Albumin: 4.1 g/dL (ref 3.5–5.2)
Alkaline Phosphatase: 79 U/L (ref 39–117)
Bilirubin, Direct: 0 mg/dL (ref 0.0–0.3)
Total Bilirubin: 0.6 mg/dL (ref 0.3–1.2)
Total Protein: 6.5 g/dL (ref 6.0–8.3)

## 2013-06-21 LAB — LIPID PANEL
Cholesterol: 253 mg/dL — ABNORMAL HIGH (ref 0–200)
HDL: 59.4 mg/dL (ref >39.00)
Total CHOL/HDL Ratio: 4
Triglycerides: 130 mg/dL (ref 0.0–149.0)
VLDL: 26 mg/dL (ref 0.0–40.0)

## 2013-06-21 LAB — LDL CHOLESTEROL, DIRECT: Direct LDL: 160.4 mg/dL

## 2013-06-23 ENCOUNTER — Telehealth: Payer: Self-pay | Admitting: Cardiology

## 2013-06-23 NOTE — Telephone Encounter (Signed)
Patient returning call regarding lab results  ?

## 2014-02-07 ENCOUNTER — Telehealth: Payer: Self-pay | Admitting: Cardiology

## 2014-02-07 ENCOUNTER — Encounter: Payer: Self-pay | Admitting: *Deleted

## 2014-02-07 DIAGNOSIS — R002 Palpitations: Secondary | ICD-10-CM

## 2014-02-07 NOTE — Telephone Encounter (Signed)
Have tried to call patient twice today and line busy. Will continue to try to reach patient

## 2014-02-07 NOTE — Telephone Encounter (Signed)
New problem    Per pt she has been having or feeling an irregular heart beat, heart feels like it' trembling pt feels weak and shakes.  Pt would like to come in today.

## 2014-02-07 NOTE — Progress Notes (Signed)
This encounter was created in error - please disregard.

## 2014-02-07 NOTE — Telephone Encounter (Signed)
Finally spoke with patient (cell number wrong in computer, reached at home). Patient states that this started a few weeks ago. Does not drink caffeine. No change in medication or diet. Feels like her heart is "fluttering" and feels in her throat. Today she felt a little weak. Patient feels it laying in bed at night and then it lasts about 30 minutes, during the day about 15 minutes. Episodes do not happen daily but a few times a week. Will discuss with  Dr. Patty Sermons in am

## 2014-02-08 ENCOUNTER — Encounter (INDEPENDENT_AMBULATORY_CARE_PROVIDER_SITE_OTHER): Payer: BC Managed Care – PPO

## 2014-02-08 ENCOUNTER — Encounter: Payer: Self-pay | Admitting: Cardiology

## 2014-02-08 ENCOUNTER — Encounter: Payer: Self-pay | Admitting: Radiology

## 2014-02-08 DIAGNOSIS — R002 Palpitations: Secondary | ICD-10-CM

## 2014-02-08 NOTE — Telephone Encounter (Signed)
Dr. Patty Sermons would like patient to wear event monitor, advised and she will com today

## 2014-02-08 NOTE — Progress Notes (Signed)
Patient ID: [name], female   DOB: Oct 02, 1955, 58 y.o.   MRN: [id number] E cardio 30 day monitor applied

## 2014-02-23 ENCOUNTER — Telehealth: Payer: Self-pay | Admitting: Cardiology

## 2014-02-23 NOTE — Telephone Encounter (Signed)
New Message  Pt called states she has a 30 day heart monitor.. Doesn't think that she will be able to wear the monitor being that she is sweating off the electro's. Pt states she is unable to wear comfortable clothes for the weather.. she is asking if she can send it back and see if the Dr. can come to a conclusion using the data already collected please call back to discuss.

## 2014-02-23 NOTE — Telephone Encounter (Signed)
Left pt a message to call back. 

## 2014-03-01 NOTE — Telephone Encounter (Signed)
Advised patient. She is out of town and will mail back when she returns.

## 2014-03-01 NOTE — Telephone Encounter (Signed)
Okay to send the monitor back and we will just review the data we already have when I return.

## 2014-03-08 ENCOUNTER — Telehealth: Payer: Self-pay | Admitting: *Deleted

## 2014-03-08 NOTE — Telephone Encounter (Signed)
Advised patient of results   Event monitor reviewed by  Dr. Patty SermonsBrackbill, interpretation:  NSR, very rare isolated PVC's. No serious arrhythmia

## 2014-06-06 ENCOUNTER — Encounter: Payer: Self-pay | Admitting: Cardiology

## 2014-06-15 ENCOUNTER — Other Ambulatory Visit: Payer: Self-pay | Admitting: Cardiology

## 2014-07-11 ENCOUNTER — Encounter: Payer: Self-pay | Admitting: Nurse Practitioner

## 2014-07-20 ENCOUNTER — Other Ambulatory Visit: Payer: Self-pay | Admitting: Nurse Practitioner

## 2014-07-22 ENCOUNTER — Encounter: Payer: Self-pay | Admitting: Cardiology

## 2014-07-22 ENCOUNTER — Ambulatory Visit (INDEPENDENT_AMBULATORY_CARE_PROVIDER_SITE_OTHER): Payer: BC Managed Care – PPO | Admitting: Cardiology

## 2014-07-22 VITALS — BP 124/82 | HR 66 | Ht 62.0 in | Wt 128.4 lb

## 2014-07-22 DIAGNOSIS — E7800 Pure hypercholesterolemia, unspecified: Secondary | ICD-10-CM

## 2014-07-22 DIAGNOSIS — I10 Essential (primary) hypertension: Secondary | ICD-10-CM

## 2014-07-22 DIAGNOSIS — R002 Palpitations: Secondary | ICD-10-CM

## 2014-07-22 DIAGNOSIS — E785 Hyperlipidemia, unspecified: Secondary | ICD-10-CM

## 2014-07-22 NOTE — Assessment & Plan Note (Signed)
Blood pressure and heart rate are doing well on low dose Bystolic 2.5 mg daily.  No chest pain or shortness of breath.  Tries to exercise

## 2014-07-22 NOTE — Patient Instructions (Signed)
CALL AND SCHEDULE YOUR FOLLOW UP LABS  Your physician wants you to follow-up in: 1 YEAR OV WITH LABS You will receive a reminder letter in the mail two months in advance. If you don't receive a letter, please call our office to schedule the follow-up appointment.

## 2014-07-22 NOTE — Assessment & Plan Note (Signed)
The patient is taking red yeast Rice for her cholesterol.  She is not on any prescribed  Statin.the patient did not come in fasting today.she will return soon for fasting lipid panel hepatic function panel and basal metabolic panel.

## 2014-07-22 NOTE — Progress Notes (Signed)
Linda McgregorBrenda Holland Le Date of Birth:  16-Nov-1955 [facility] [address] [place], [place]  [id number] [phone]  Fax   [phone]  HPI: This pleasant 58 year old woman is seen for a one-year follow-up office visit.  She has a past history of essential hypertension and hyperlipidemia.  She is a widow.  Her husband died from pancreatic cancer several years ago.  Her father died about a year ago from lung cancer and sudden cardiac death.  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  The patient has one son who is a sophomore at [employer] and is doing very well.  The patient had a past history of palpitations and had an event monitor in June 2015 which did not show any arrhythmia.  Current Outpatient Prescriptions  Medication Sig Dispense Refill  . aspirin 81 MG tablet Take 81 mg by mouth daily.    . Biotin 1 MG CAPS Take by mouth daily.    Marland Kitchen. BYSTOLIC 2.5 MG tablet TAKE 1 TABLET BY MOUTH EVERY DAY 30 tablet 11  . Calcium Carbonate-Vitamin D (CALCIUM + D PO) Take by mouth daily.    . Multiple Vitamin (MULTIVITAMIN) tablet Take 1 tablet by mouth daily.     No current facility-administered medications for this visit.    Allergies  Allergen Reactions  . Amoxicillin   . Codeine     nausea  . Megace [Megestrol]   . Morphine And Related     Patient Active Problem List   Diagnosis Date Noted  . Menopause 03/11/2012  . Tiredness 03/11/2012  . Pain of left hand 10/15/2011  . HTN (hypertension) 05/21/2011  . Stress   . HYPERLIPIDEMIA 04/09/2010  . COLONIC POLYPS, HX OF 04/09/2010    History  Smoking status  . Never Smoker   Smokeless tobacco  . Never Used    History  Alcohol Use No    Family History  Problem Relation Age of Onset  . Hypertension Mother   . Hypertension Father   . Cancer Father     LUNG    Review of Systems: The patient denies any heat or cold intolerance.  No weight gain or weight loss.  The  patient denies headaches or blurry vision.  There is no cough or sputum production.  The patient denies dizziness.  There is no hematuria or hematochezia.  The patient denies any muscle aches or arthritis.  The patient denies any rash.  The patient denies frequent falling or instability.  There is no history of depression or anxiety.  All other systems were reviewed and are negative.   Physical Exam: Filed Vitals:   07/22/14 1042  BP: 124/82  Pulse: 66  The patient appears to be in no distress.  Head and neck exam reveals that the pupils are equal and reactive.  The extraocular movements are full.  There is no scleral icterus.  Mouth and pharynx are benign.  No lymphadenopathy.  No carotid bruits.  The jugular venous pressure is normal.  Thyroid is not enlarged or tender.  Chest is clear to percussion and auscultation.  No rales or rhonchi.  Expansion of the chest is symmetrical.  Heart reveals no abnormal lift or heave.  First and second heart sounds are normal.  There is no murmur gallop rub or click.  The abdomen is soft and nontender.  Bowel sounds are normoactive.  There is no hepatosplenomegaly or mass.  There are no abdominal bruits.  Extremities reveal no phlebitis or edema.  Pedal pulses are good.  There is no cyanosis or clubbing.  Neurologic exam is normal strength and no lateralizing weakness.  No sensory deficits.  Integument reveals no rash  EKG today shows normal sinus rhythm and is within normal limits   Assessment / Plan: 1. Essential hypertension 2.  Past history of palpitations 3.  Hypercholesterolemia  Plan: Continue current medication.  Recheck in one year for office visit lipid panel hepatic function panel and basal metabolic panel.  Return soon for fasting lab work.

## 2015-01-09 ENCOUNTER — Other Ambulatory Visit: Payer: Self-pay | Admitting: Internal Medicine

## 2015-01-09 DIAGNOSIS — M79605 Pain in left leg: Principal | ICD-10-CM

## 2015-01-09 DIAGNOSIS — M79604 Pain in right leg: Secondary | ICD-10-CM

## 2015-01-10 ENCOUNTER — Encounter: Payer: Self-pay | Admitting: Gynecology

## 2015-01-13 ENCOUNTER — Ambulatory Visit
Admission: RE | Admit: 2015-01-13 | Discharge: 2015-01-13 | Disposition: A | Discharge status: Home / Self Care | Payer: 59 | Source: Ambulatory Visit | Attending: Internal Medicine | Admitting: Internal Medicine

## 2015-01-13 DIAGNOSIS — M79604 Pain in right leg: Secondary | ICD-10-CM

## 2015-01-13 DIAGNOSIS — M79605 Pain in left leg: Principal | ICD-10-CM

## 2015-01-16 ENCOUNTER — Other Ambulatory Visit: Payer: Self-pay

## 2015-06-28 ENCOUNTER — Other Ambulatory Visit: Payer: Self-pay | Admitting: Cardiology

## 2015-07-18 NOTE — Progress Notes (Signed)
Cardiology Office Note   Date:  07/19/2015   ID:  Pansey, Trana [birth year], MRN [id number]   Patient Care Team: Pearson Grippe, MD as PCP - General (Internal Medicine) Cassell Clement, MD as Consulting Physician (Cardiology) Rosalio Macadamia, NP as Nurse Practitioner (Cardiology)    Chief Complaint  Patient presents with  . Follow-up    HTN     History of Present Illness: [name] is a 59 y.o. female with a hx of HTN, HL, palpitations with unremarkable event monitor in 2015. Last seen by Dr. Patty Sermons 11/15. Returns for follow-up.  Overall doing well.  The patient denies chest pain, shortness of breath, syncope, orthopnea, PND or significant pedal edema. She tries to exercise a few times a week.     Studies/Reports Reviewed Today:  Event monitor 6/15 NSR, rare PVC, no arrhythmia  Myoview 3/12 No ischemia, EF 50%, normal study   Past Medical History  Diagnosis Date  . Hypertension   . Hyperlipidemia   . Stress     situational  . Trisomy 18     prior pregnancy with Trisomy 18  . SAB (spontaneous abortion)   . Polyp of colon 2009    benign  . Anxiety   . Depression     Past Surgical History  Procedure Laterality Date  . Abdominal hysterectomy  03/04/2006    BSO  . Myomectomy    . Other surgical history      c section x 2  . Carpal tunnel release  2004    right  . Cesarean section      x 2 ( 1 with trisomy 18)     Current Outpatient Prescriptions  Medication Sig Dispense Refill  . aspirin 81 MG tablet Take 81 mg by mouth daily.    . Biotin 1 MG CAPS Take by mouth daily.    . Calcium Carbonate-Vitamin D (CALCIUM + D PO) Take by mouth daily.    . Cyanocobalamin (VITAMIN B12 PO) Take 1 tablet by mouth daily.    Marland Kitchen MAGNESIUM CARBONATE PO Take 1 tablet by mouth daily.    . Multiple Vitamin (MULTIVITAMIN) tablet Take 1 tablet by mouth daily.    . nebivolol (BYSTOLIC) 2.5 MG tablet Take 1 tablet (2.5 mg total) by mouth daily. 90 tablet 3    No current facility-administered medications for this visit.    Allergies:   Amoxicillin; Codeine; Megace; and Morphine and related    Social History:   Social History   Social History  . Marital Status: Widowed    Spouse Name: N/A  . Number of Children: N/A  . Years of Education: N/A   Social History Main Topics  . Smoking status: Never Smoker   . Smokeless tobacco: Never Used  . Alcohol Use: No  . Drug Use: No  . Sexual Activity: No   Other Topics Concern  . None   Social History Narrative    Family History:   Family History  Problem Relation Age of Onset  . Hypertension Mother   . Hypertension Father   . Cancer Father     LUNG     ROS:   Please see the history of present illness.   Review of Systems  All other systems reviewed and are negative.     PHYSICAL EXAM: VS:  BP 120/70 mmHg  Pulse 67  Ht 5' 1.5" (1.562 m)  Wt 130 lb 1.9 oz (59.022 kg)  BMI 24.19 kg/m2    Wt  Readings from Last 3 Encounters:  07/19/15 130 lb 1.9 oz (59.022 kg)  07/22/14 128 lb 6.4 oz (58.242 kg)  06/15/13 134 lb (60.782 kg)     GEN: Well nourished, well developed, in no acute distress HEENT: normal Neck: no JVD, no carotid bruits, no masses Cardiac:  Normal S1/S2, RRR; no murmur ,  no rubs or gallops, no edema   Respiratory:  clear to auscultation bilaterally, no wheezing, rhonchi or rales. GI: soft, nontender, nondistended, + BS MS: no deformity or atrophy Skin: warm and dry  Neuro:  CNs II-XII intact, Strength and sensation are intact Psych: Normal affect   EKG:  EKG is ordered today.  It demonstrates:   NSR, HR 67, normal axis, NSSTTW changes, QTc 441 ms   Recent Labs: No results found for requested labs within last 365 days.  12/2014 (with PCP):  Hgb 14.4, ALT 16, Creatinine 0.73, K 4.3, TC 278, HDL 73, TG 106, LDL 184   Lipid Panel    Component Value Date/Time   CHOL 253* 06/21/2013 0924   TRIG 130.0 06/21/2013 0924   HDL 59.40 06/21/2013 0924    CHOLHDL 4 06/21/2013 0924   VLDL 26.0 06/21/2013 0924   LDLCALC 113* 05/21/2011 1108   LDLDIRECT 160.4 06/21/2013 0924      ASSESSMENT AND PLAN:  1. HTN:  Controlled.  Continue beta-blocker.   2. Hyperlipidemia:  Followed by PCP.  LDL in 4/16 was 184.  10 year CV risk is 3.5%.  She is hesitant to start medical Rx.  Recommend continued diet and exercise and close FU with her PCP to repeat Lipids in 6-12 mos.  Statin Rx is not indicated unless her LDL remains > 160.  If diet and exercise do not help her reach her goal, could consider statin rx vs referral to Lipid Clinic for consideration of PCSK-9 inhibitor.    3. Palpitations:  Quiescent.     Medication Changes: Current medicines are reviewed at length with the patient today.  Concerns regarding medicines are as outlined above.  The following changes have been made:   Discontinued Medications   No medications on file   Modified Medications   Modified Medication Previous Medication   NEBIVOLOL (BYSTOLIC) 2.5 MG TABLET BYSTOLIC 2.5 MG tablet      Take 1 tablet (2.5 mg total) by mouth daily.    TAKE 1 TABLET BY MOUTH DAILY   New Prescriptions   No medications on file   Labs/ tests ordered today include:   Orders Placed This Encounter  Procedures  . EKG 12-Lead     Disposition:    Reviewed with Dr. Cassell Clement who recommended FU with Dr. Tobias Alexander in the future.  FU with Dr. Tobias Alexander 1 year.     Signed, Brynda Rim, MHS 07/19/2015 4:02 PM    [facility] [address] [place], [place], [place]  [address] Phone: [phone]; Fax: [phone]

## 2015-07-19 ENCOUNTER — Encounter: Payer: Self-pay | Admitting: Physician Assistant

## 2015-07-19 ENCOUNTER — Ambulatory Visit (INDEPENDENT_AMBULATORY_CARE_PROVIDER_SITE_OTHER): Payer: 59 | Admitting: Physician Assistant

## 2015-07-19 VITALS — BP 120/70 | HR 67 | Ht 61.5 in | Wt 130.1 lb

## 2015-07-19 DIAGNOSIS — R002 Palpitations: Secondary | ICD-10-CM | POA: Diagnosis not present

## 2015-07-19 DIAGNOSIS — E785 Hyperlipidemia, unspecified: Secondary | ICD-10-CM | POA: Diagnosis not present

## 2015-07-19 DIAGNOSIS — I10 Essential (primary) hypertension: Secondary | ICD-10-CM

## 2015-07-19 MED ORDER — NEBIVOLOL HCL 2.5 MG PO TABS: 2.5000 mg | ORAL_TABLET | Freq: Every day | ORAL | Status: DC

## 2015-07-19 NOTE — Patient Instructions (Signed)
Medication Instructions:  Your physician recommends that you continue on your current medications as directed. Please refer to the Current Medication list given to you today.   Labwork: NONE  Testing/Procedures: NONE  Follow-Up: DR. Delton SeeNELSON IN 1 YEAR  Any Other Special Instructions Will Be Listed Below (If Applicable).    If you need a refill on your cardiac medications before your next appointment, please call your pharmacy.

## 2015-10-09 ENCOUNTER — Telehealth: Payer: Self-pay | Admitting: Cardiology

## 2015-10-09 ENCOUNTER — Telehealth: Payer: Self-pay

## 2015-10-09 NOTE — Telephone Encounter (Signed)
PRIOR AUTH FOR BYSTOLIC 2.5MG  SENT TO [employer].

## 2015-10-09 NOTE — Telephone Encounter (Signed)
New message   [employer] is calling to speak to RN about the arthorization for medication  BYSTOLIC 2.5mg 

## 2015-10-10 ENCOUNTER — Telehealth: Payer: Self-pay | Admitting: Physician Assistant

## 2015-10-10 NOTE — Telephone Encounter (Signed)
She has been on this for years and it was started by Dr. Patty Sermons. I have only seen her once for FU and refilled the medication. I would check with Dr. Patty Sermons to see if he has any objections to changing her to Metoprolol succinate. Tereso Newcomer, PA-C   10/10/2015 1:21 PM

## 2015-10-10 NOTE — Telephone Encounter (Signed)
Okay to switch to metoprolol succinate.

## 2015-10-10 NOTE — Telephone Encounter (Signed)
Left message for patient that her pharmacy called and medication changes will be made. Instructed her to call tomorrow to review instructions.

## 2015-10-10 NOTE — Telephone Encounter (Signed)
New message      Insurance denied prior auth on bystolic.  Do you want to change it to something else?

## 2015-10-10 NOTE — Telephone Encounter (Signed)
DC Bystolic Start Toprol-XL 25 mg QD. Tereso Newcomer, PA-C   10/10/2015 4:57 PM

## 2015-10-11 ENCOUNTER — Telehealth: Payer: Self-pay

## 2015-10-11 MED ORDER — METOPROLOL SUCCINATE ER 25 MG PO TB24: 25.0000 mg | ORAL_TABLET | Freq: Every day | ORAL | Status: DC

## 2015-10-11 NOTE — Telephone Encounter (Signed)
Bystolic denied per [employer]. They state denial is because she has not tried and failed 2 formulary alternatives. I only saw Metoprolol as being tried.

## 2015-10-11 NOTE — Telephone Encounter (Signed)
Pt aware of med change; d/c Bystolic and start Toprol XL 25 mg daily due to [employer] needs a PA done. Per Dr. Patty Sermons and Bing Neighbors. PA change over to Toprol XL. Pt agreeable to this plan of care. Pt asked what if she does not like how the Toprol makes her feel. I advised that she should give it about 2 weeks to see how she does and if she wants to go back to the Bystolic we can go ahead and try to put through an PA for [facility] for coverage. Pt agreeable to this plan as well.

## 2015-10-11 NOTE — Telephone Encounter (Signed)
Tarri Fuller, CMA at 10/11/2015 9:52 AM     Status: Signed       Expand All Collapse All   Pt aware of med change; d/c Bystolic and start Toprol XL 25 mg daily due to [employer] needs a PA done. Per Dr. Patty Sermons and Bing Neighbors. PA change over to Toprol XL. Pt agreeable to this plan of care. Pt asked what if she does not like how the Toprol makes her feel. I advised that she should give it about 2 weeks to see how she does and if she wants to go back to the Bystolic we can go ahead and try to put through an PA for [facility] for coverage. Pt agreeable to this plan as well.             Henrietta Dine, RN at 10/10/2015 5:12 PM     Status: Signed       Expand All Collapse All   Left message for patient that her pharmacy called and medication changes will be made. Instructed her to call tomorrow to review instructions.            Beatrice Lecher, PA-C at 10/10/2015 4:57 PM     Status: Signed       Expand All Collapse All   DC Bystolic Start Toprol-XL 25 mg QD. Tereso Newcomer, PA-C  10/10/2015 4:57 PM             Cassell Clement, MD at 10/10/2015 4:03 PM     Status: Signed       Expand All Collapse All   Okay to switch to metoprolol succinate.            Beatrice Lecher, PA-C at 10/10/2015 1:19 PM     Status: Signed       Expand All Collapse All   She has been on this for years and it was started by Dr. Patty Sermons. I have only seen her once for FU and refilled the medication. I would check with Dr. Patty Sermons to see if he has any objections to changing her to Metoprolol succinate. Tereso Newcomer, PA-C  10/10/2015 1:21 PM             Cecil Cranker Price at 10/10/2015 1:10 PM     Status: Signed       Expand All Collapse All   New message      Insurance denied prior auth on bystolic. Do you want to change it to something else?

## 2015-10-12 ENCOUNTER — Telehealth: Payer: Self-pay | Admitting: *Deleted

## 2015-10-12 NOTE — Telephone Encounter (Signed)
Patient called back and stated that after much thought, she has decided that she does not want to try the metoprolol. She stated that she spoke with her insurance and they stated that the physicians office could submit a form indicating that the patient needs to remain on the bystolic and it will likely be approved. Please advise. Thanks, MI

## 2015-10-12 NOTE — Telephone Encounter (Signed)
Spoke with patient and she has appointment with Donnel Saxon PA tomorrow

## 2015-10-13 ENCOUNTER — Ambulatory Visit (INDEPENDENT_AMBULATORY_CARE_PROVIDER_SITE_OTHER): Payer: BLUE CROSS/BLUE SHIELD | Admitting: Physician Assistant

## 2015-10-13 ENCOUNTER — Encounter: Payer: Self-pay | Admitting: Physician Assistant

## 2015-10-13 VITALS — BP 132/80 | HR 64 | Ht 61.5 in | Wt 133.8 lb

## 2015-10-13 DIAGNOSIS — F32A Depression, unspecified: Secondary | ICD-10-CM

## 2015-10-13 DIAGNOSIS — E785 Hyperlipidemia, unspecified: Secondary | ICD-10-CM | POA: Diagnosis not present

## 2015-10-13 DIAGNOSIS — F419 Anxiety disorder, unspecified: Secondary | ICD-10-CM

## 2015-10-13 DIAGNOSIS — R002 Palpitations: Secondary | ICD-10-CM | POA: Diagnosis not present

## 2015-10-13 DIAGNOSIS — I10 Essential (primary) hypertension: Secondary | ICD-10-CM | POA: Diagnosis not present

## 2015-10-13 DIAGNOSIS — F329 Major depressive disorder, single episode, unspecified: Secondary | ICD-10-CM

## 2015-10-13 NOTE — Patient Instructions (Signed)
Dayna Dunn, PA-C, has made no changes to your current medications.  Your physician recommends that you schedule a follow-up appointment in November 2017. You will receive a reminder letter in the mail two months in advance. If you don't receive a letter, please call our office to schedule the follow-up appointment.  If you need a refill on your cardiac medications before your next appointment, please call your pharmacy.

## 2015-10-13 NOTE — Progress Notes (Addendum)
Cardiology Office Note Date:  10/13/2015  Patient ID:  [name], [name] [birth year], MRN [id number] PCP:  Pearson Grippe, MD  Cardiologist:  Dr. Patty Sermons (who recommended f/u Dr. Delton See upon his retirement - will see her 07/2016)  Chief Complaint: medication f/u  History of Present Illness: [name] is a 60 y.o. female with history of HTN, HLD, palpitations (prior event monitor 02/2014: NSR, rare PVC) who presents for f/u. She has history of nuclear stress test in 2012 showing no ischemia, EF 50%, normal study.  She has history of palpitations that started around the time her husband was dying from cancer. She recalls that when she first started Bystolic, she developed a year's worth of "weird symptoms similar to rheumatoid arthritis." She says she realized in retrospect some of these symptoms may have been due to depression as she has tolerated Bystolic very well since that time. In fact, her insurance company recently declined Bystolic (since it is not on their preferred list) and it has caused her anxiety. She was offered to change to Toprol  daily. She was reading about the side effects and became nervous so did not start it yet. She has been buying the Bystolic at retail price in the meantime. She wanted to come in to discuss what the potential risks of switching are. She has not had any chest pain, SOB, palpitations or syncope.   Past Medical History  Diagnosis Date  . Hypertension   . Hyperlipidemia   . Stress     situational  . Trisomy 18     prior pregnancy with Trisomy 18  . SAB (spontaneous abortion)   . Polyp of colon 2009    benign  . Anxiety   . Depression   . PVC's (premature ventricular contractions)     a. prior event monitor 02/2014: NSR, rare PVC.    Past Surgical History  Procedure Laterality Date  . Abdominal hysterectomy  03/04/2006    BSO  . Myomectomy    . Other surgical history      c section x 2  . Carpal tunnel release  2004    right    . Cesarean section      x 2 ( 1 with trisomy 18)    Current Outpatient Prescriptions  Medication Sig Dispense Refill  . aspirin 81 MG tablet Take 81 mg by mouth daily.    . Biotin 1 MG CAPS Take by mouth daily.    . Calcium Carbonate-Vitamin D (CALCIUM + D PO) Take by mouth daily.    . Cyanocobalamin (VITAMIN B12 PO) Take 1 tablet by mouth daily.    Marland Kitchen MAGNESIUM CARBONATE PO Take 1 tablet by mouth daily.    . metoprolol succinate (TOPROL-XL) 25 MG 24 hr tablet Take 1 tablet (25 mg total) by mouth daily. 30 tablet 11  . Multiple Vitamin (MULTIVITAMIN) tablet Take 1 tablet by mouth daily.    . Red Yeast Rice Extract (RED YEAST RICE PO) Take 1 capsule by mouth daily.     No current facility-administered medications for this visit.    Allergies:   Amoxicillin; Codeine; Megace; and Morphine and related   Social History:  The patient  reports that she has never smoked. She has never used smokeless tobacco. She reports that she does not drink alcohol or use illicit drugs.   Family History:  The patient's family history includes Atrial fibrillation in her father; Cancer in her father; Hypertension in her father and mother. There is  no history of Heart attack or Stroke.  ROS:  Please see the history of present illness. Occasional LEE after standing all day at work. All other systems are reviewed and otherwise negative.   PHYSICAL EXAM:  VS:  BP 132/80 mmHg  Pulse 64  Ht 5' 1.5" (1.562 m)  Wt 133 lb 12.8 oz (60.691 kg)  BMI 24.87 kg/m2 BMI: Body mass index is 24.87 kg/(m^2). Well nourished, well developed WF, in no acute distress HEENT: normocephalic, atraumatic Neck: no JVD, carotid bruits or masses Cardiac:  normal S1, S2; RRR; no murmurs, rubs, or gallops Lungs:  clear to auscultation bilaterally, no wheezing, rhonchi or rales Abd: soft, nontender, no hepatomegaly, + BS MS: no deformity or atrophy Ext: no edema Skin: warm and dry, no rash Neuro:  moves all extremities  spontaneously, no focal abnormalities noted, follows commands Psych: euthymic mood, full affect  EKG:  Not done.  Recent Labs: No results found for requested labs within last 365 days.  No results found for requested labs within last 365 days.   CrCl cannot be calculated (Patient has no serum creatinine result on file.).   Wt Readings from Last 3 Encounters:  10/13/15 133 lb 12.8 oz (60.691 kg)  07/19/15 130 lb 1.9 oz (59.022 kg)  07/22/14 128 lb 6.4 oz (58.242 kg)     Other studies reviewed: Additional studies/records reviewed today include: summarized above  ASSESSMENT AND PLAN:  1. Palpitations - quiescent on beta blocker therapy. Long discussion with patient about various side effects and mechanism of action of beta blockers. She has struggled with the decision to continue paying for Bystolic out of pocket versus trying metoprolol instead. Ultimately she has decided to try metoprolol. This has already been sent into her pharmacy per prior phone notes. She will let us know in a week if things are not going well. If that becomes the case, I would be happy to advocate prior authorization for her to return to Bystolic in light of prior history of anxiety/depression. 2. Essential HTN - controlled. 3. Hyperlipidemia - followed by primary care. 4. Anxiety/depression - exhibits good insight. Follow for worsening sx on beta blocker therapy. No SI/HI demonstrated.  Disposition: F/u with Dr. Delton See 07/2016 as previously recommended.  Current medicines are reviewed at length with the patient today.  The patient did not have any concerns regarding medicines.  Signed, Ronie Spies PA-C 10/13/2015 2:15 PM     [facility] [address] [place] [address] [phone] (office)  [phone] (fax)

## 2015-10-25 ENCOUNTER — Other Ambulatory Visit: Payer: Self-pay

## 2015-10-25 DIAGNOSIS — Z1231 Encounter for screening mammogram for malignant neoplasm of breast: Secondary | ICD-10-CM

## 2015-10-26 ENCOUNTER — Ambulatory Visit
Admission: RE | Admit: 2015-10-26 | Discharge: 2015-10-26 | Disposition: A | Discharge status: Home / Self Care | Payer: BLUE CROSS/BLUE SHIELD | Source: Ambulatory Visit

## 2015-10-26 DIAGNOSIS — Z1231 Encounter for screening mammogram for malignant neoplasm of breast: Secondary | ICD-10-CM

## 2016-05-14 DIAGNOSIS — D235 Other benign neoplasm of skin of trunk: Secondary | ICD-10-CM | POA: Diagnosis not present

## 2016-05-14 DIAGNOSIS — D485 Neoplasm of uncertain behavior of skin: Secondary | ICD-10-CM | POA: Diagnosis not present

## 2016-05-14 DIAGNOSIS — D1801 Hemangioma of skin and subcutaneous tissue: Secondary | ICD-10-CM | POA: Diagnosis not present

## 2016-05-14 DIAGNOSIS — D2262 Melanocytic nevi of left upper limb, including shoulder: Secondary | ICD-10-CM | POA: Diagnosis not present

## 2016-05-14 DIAGNOSIS — L814 Other melanin hyperpigmentation: Secondary | ICD-10-CM | POA: Diagnosis not present

## 2016-05-14 DIAGNOSIS — L821 Other seborrheic keratosis: Secondary | ICD-10-CM | POA: Diagnosis not present

## 2016-07-18 ENCOUNTER — Encounter: Payer: Self-pay | Admitting: *Deleted

## 2016-07-30 ENCOUNTER — Encounter: Payer: Self-pay | Admitting: Cardiology

## 2016-07-30 ENCOUNTER — Encounter (INDEPENDENT_AMBULATORY_CARE_PROVIDER_SITE_OTHER): Payer: Self-pay

## 2016-07-30 ENCOUNTER — Ambulatory Visit (INDEPENDENT_AMBULATORY_CARE_PROVIDER_SITE_OTHER): Payer: BLUE CROSS/BLUE SHIELD | Admitting: Cardiology

## 2016-07-30 VITALS — BP 112/78 | HR 74 | Ht 61.5 in | Wt 128.4 lb

## 2016-07-30 DIAGNOSIS — I10 Essential (primary) hypertension: Secondary | ICD-10-CM

## 2016-07-30 DIAGNOSIS — E7800 Pure hypercholesterolemia, unspecified: Secondary | ICD-10-CM

## 2016-07-30 MED ORDER — METOPROLOL SUCCINATE ER 25 MG PO TB24: 25.0000 mg | ORAL_TABLET | Freq: Every day | ORAL | 3 refills | Status: DC

## 2016-07-30 NOTE — Patient Instructions (Signed)
Medication Instructions:  Your physician recommends that you continue on your current medications as directed. Please refer to the Current Medication list given to you today.  Metoprolol has been refilled today  Labwork: Your physician recommends that you return for a FASTING lipid profile and Hepatic Jan 2018  Testing/Procedures: None ordered  Follow-Up: Your physician wants you to follow-up in: 1 year with Dr.Nelson You will receive a reminder letter in the mail two months in advance. If you don't receive a letter, please call our office to schedule the follow-up appointment.   Any Other Special Instructions Will Be Listed Below (If Applicable).     If you need a refill on your cardiac medications before your next appointment, please call your pharmacy.

## 2016-07-30 NOTE — Progress Notes (Signed)
07/30/2016 [name]   05/31/1956  [id number]  Primary Physician Pearson Grippe, MD Primary Cardiologist: Dr. Patty Sermons    Reason for Visit/CC: F/u for HTN  HPI:  The patient is a 60 y/o female, previously followed by Dr. Patty Sermons, who presents to clinic for 1 year f/u. She has a h/o HTN, HLD and palpitations. Cardiac monitor in 2015 was unremarkable. She is on metoprolol for for BP. She has been hesitant to try statins. She has been attempting to control her cholesterol through diet, exercise and daily use of red yeast rice.  She reports that she has done well since her last OV. No recurrent issues with palpitations. No chest pain or dyspnea. No exertional symptoms. EKG shows NSR w/o ischemia. HR is well controlled in the 70s. BP is also well controlled on metoprolol at 112/78. She is in need of medication refills. She states that she was told that Dr. Delton See will be her new primary cardiologist, but has not seen her yet.   Current Meds  Medication Sig  . aspirin 81 MG tablet Take 81 mg by mouth daily.  . Biotin 1 MG CAPS Take by mouth daily.  . Calcium Carbonate-Vitamin D (CALCIUM + D PO) Take by mouth daily.  Marland Kitchen MAGNESIUM CARBONATE PO Take 1 tablet by mouth daily.  . metoprolol succinate (TOPROL-XL) 25 MG 24 hr tablet Take 1 tablet (25 mg total) by mouth daily.  . Multiple Vitamin (MULTIVITAMIN) tablet Take 1 tablet by mouth daily.  . Red Yeast Rice Extract (RED YEAST RICE PO) Take 1 capsule by mouth daily.   Allergies  Allergen Reactions  . Amoxicillin   . Codeine     nausea  . Megace [Megestrol]   . Morphine And Related    Past Medical History:  Diagnosis Date  . Anxiety   . Depression   . Hyperlipidemia   . Hypertension   . Polyp of colon 2009   benign  . PVC's (premature ventricular contractions)    a. prior event monitor 02/2014: NSR, rare PVC.  Marland Kitchen SAB (spontaneous abortion)   . Stress    situational  . Trisomy 18    prior pregnancy with Trisomy 37    Family History  Problem Relation Age of Onset  . Hypertension Mother   . Hypertension Father   . Atrial fibrillation Father   . Lung cancer Father     LUNG  . Heart attack Neg Hx   . Stroke Neg Hx    Past Surgical History:  Procedure Laterality Date  . CARPAL TUNNEL RELEASE Right 2004  . CESAREAN SECTION     x 2 ( 1 with trisomy 18)  . MYOMECTOMY    . TOTAL ABDOMINAL HYSTERECTOMY W/ BILATERAL SALPINGOOPHORECTOMY  03/04/2006   Social History   Social History  . Marital status: Widowed    Spouse name: N/A  . Number of children: N/A  . Years of education: N/A   Occupational History  . Not on file.   Social History Main Topics  . Smoking status: Never Smoker  . Smokeless tobacco: Never Used  . Alcohol use No  . Drug use: No  . Sexual activity: No   Other Topics Concern  . Not on file   Social History Narrative  . No narrative on file     Review of Systems: General: negative for chills, fever, night sweats or weight changes.  Cardiovascular: negative for chest pain, dyspnea on exertion, edema, orthopnea, palpitations, paroxysmal nocturnal dyspnea or shortness  of breath Dermatological: negative for rash Respiratory: negative for cough or wheezing Urologic: negative for hematuria Abdominal: negative for nausea, vomiting, diarrhea, bright red blood per rectum, melena, or hematemesis Neurologic: negative for visual changes, syncope, or dizziness All other systems reviewed and are otherwise negative except as noted above.   Physical Exam:  Blood pressure 112/78, pulse 74, height 5' 1.5" (1.562 m), weight 128 lb 6.4 oz (58.2 kg).  General appearance: alert, cooperative and no distress Neck: no carotid bruit and no JVD Lungs: clear to auscultation bilaterally Heart: regular rate and rhythm, S1, S2 normal, no murmur, click, rub or gallop Extremities: extremities normal, atraumatic, no cyanosis or edema Pulses: 2+ and symmetric Skin: Skin color, texture, turgor  normal. No rashes or lesions Neurologic: Grossly normal  EKG NSR. No ischemia.   ASSESSMENT AND PLAN:   1. Palpitations: Monitor in 2015 was unremarkable. She denies any recurrence. HR is well controlled with metoprolol.   2. HTN: BP is well controlled on metoprolol. HR is stable. Will refill metoprolol Rx.   3. HLD: patient attempting to control through diet and exercise + use of red yeast rice. She notes her levels have not been checked recently. We will order a FLP.   PLAN  F/u in 1 year.   Robbie Lis PA-C 07/30/2016 3:42 PM

## 2016-10-02 ENCOUNTER — Other Ambulatory Visit: Payer: Self-pay | Admitting: Physician Assistant

## 2016-10-02 NOTE — Telephone Encounter (Signed)
Medication Detail    Disp Refills Start End   metoprolol succinate (TOPROL-XL) 25 MG 24 hr tablet 90 tablet 3 07/30/2016    Sig - Route: Take 1 tablet (25 mg total) by mouth daily. - Oral   Notes to Pharmacy: CHANGE IN THERAPY   E-Prescribing Status: Receipt confirmed by pharmacy (07/30/2016 4:02 PM EST)   Pharmacy   [facility] [phone] - [place], [place] - [address]

## 2016-12-24 DIAGNOSIS — L858 Other specified epidermal thickening: Secondary | ICD-10-CM | POA: Diagnosis not present

## 2016-12-24 DIAGNOSIS — D229 Melanocytic nevi, unspecified: Secondary | ICD-10-CM | POA: Diagnosis not present

## 2016-12-24 DIAGNOSIS — D1800 Hemangioma unspecified site: Secondary | ICD-10-CM | POA: Diagnosis not present

## 2016-12-24 DIAGNOSIS — L910 Hypertrophic scar: Secondary | ICD-10-CM | POA: Diagnosis not present

## 2017-01-22 ENCOUNTER — Encounter: Payer: Self-pay | Admitting: Gynecology

## 2017-06-25 ENCOUNTER — Encounter: Payer: Self-pay | Admitting: Physician Assistant

## 2017-07-08 DIAGNOSIS — R002 Palpitations: Secondary | ICD-10-CM

## 2017-07-08 HISTORY — DX: Palpitations: R00.2

## 2017-07-08 NOTE — Progress Notes (Signed)
Cardiology Office Note:    Date:  07/09/2017   ID:  [name], DOB [birth year], MRN [id number]  PCP:  Pearson Grippe, MD  Cardiologist:  Dr. Cassell Clement >> Dr. Tobias Alexander    Referring MD: Pearson Grippe, MD   Chief Complaint  Patient presents with  . Follow-up    Blood pressure, palpitations  . Shortness of Breath    History of Present Illness:    [name] is a 61 y.o. female with a hx of HTN, HL, palpitations with unremarkable event monitor in 2015.  She is a prior patient of Dr. Patty Sermons.  She is to establish with Dr. Delton See but still has not seen her.    [name] returns for follow-up.  She is here alone.  Her only child (son) went to college this year.  She has been somewhat stressed since that time.  She has not been able to work out for many years.  She is the primary caregiver for her mother.  Her husband passed away several years ago.  She feels that she has been somewhat short of breath lately.  She also notes decreased exercise tolerance.  She denies PND or edema.  She denies chest pain or syncope.  She did try to work out yesterday.  She felt okay but did feel somewhat out of breath.  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  She is exposed to lots of chemicals at work.  She is somewhat concerned about lung cancer.  Prior CV studies:   The following studies were reviewed today:  Event monitor 6/15 NSR, rare PVC, no arrhythmia  Myoview 3/12 No ischemia, EF 50%, normal study  Past Medical History:  Diagnosis Date  . Anxiety   . Depression   . Hyperlipidemia   . Hypertension   . Polyp of colon 2009   benign  . PVC's (premature ventricular contractions)    a. prior event monitor 02/2014: NSR, rare PVC.  Marland Kitchen SAB (spontaneous abortion)   . Stress    situational  . Trisomy 18    prior pregnancy with Trisomy 18    Past Surgical History:  Procedure Laterality Date  . CARPAL TUNNEL RELEASE Right 2004  .  CESAREAN SECTION     x 2 ( 1 with trisomy 18)  . MYOMECTOMY    . TOTAL ABDOMINAL HYSTERECTOMY W/ BILATERAL SALPINGOOPHORECTOMY  03/04/2006    Current Medications: Current Meds  Medication Sig  . aspirin 81 MG tablet Take 81 mg by mouth daily.  . Biotin 5000 MCG TABS TAKE 2 TABLETS BY MOUTH DAILY  . Calcium Carbonate-Vitamin D (CALCIUM + D PO) Take 1 tablet by mouth daily.   . Cyanocobalamin (VITAMIN B 12 PO) Take 500 mcg by mouth daily.  . folic acid (FOLVITE) 1 MG tablet Take 400 mcg by mouth daily.  Marland Kitchen MAGNESIUM CARBONATE PO Take 1 tablet by mouth daily.  . Multiple Vitamin (MULTIVITAMIN) tablet Take 2 tablets by mouth daily.   . Red Yeast Rice Extract (RED YEAST RICE PO) Take 2 capsules by mouth daily.   . [DISCONTINUED] metoprolol succinate (TOPROL-XL) 25 MG 24 hr tablet Take 1 tablet (25 mg total) by mouth daily.     Allergies:   Amoxicillin; Codeine; Megace [megestrol]; and Morphine and related   Social History  Substance Use Topics  . Smoking status: Never Smoker  . Smokeless tobacco: Never Used  . Alcohol use No     Family  Hx: The patient's family history includes Atrial fibrillation in her father; Hypertension in her father and mother; Lung cancer in her father. There is no history of Heart attack or Stroke.  ROS:   Please see the history of present illness.    ROS All other systems reviewed and are negative.   EKGs/Labs/Other Test Reviewed:    EKG:  EKG is   ordered today.  The ekg ordered today demonstrates normal sinus rhythm, heart rate 67, normal axis, QTC 439 ms  Recent Labs: No results found for requested labs within last 8760 hours.   Recent Lipid Panel Lab Results  Component Value Date/Time   CHOL 253 (H) 06/21/2013 09:24 AM   TRIG 130.0 06/21/2013 09:24 AM   HDL 59.40 06/21/2013 09:24 AM   CHOLHDL 4 06/21/2013 09:24 AM   LDLCALC 113 (H) 05/21/2011 11:08 AM   LDLDIRECT 160.4 06/21/2013 09:24 AM    Physical Exam:    VS:  BP 120/80   Pulse 67    Ht 5' 1.5" (1.562 m)   Wt 133 lb 1.9 oz (60.4 kg)   SpO2 98%   BMI 24.75 kg/m     Wt Readings from Last 3 Encounters:  07/09/17 133 lb 1.9 oz (60.4 kg)  07/30/16 128 lb 6.4 oz (58.2 kg)  10/13/15 133 lb 12.8 oz (60.7 kg)     Physical Exam  Constitutional: She is oriented to person, place, and time. She appears well-developed and well-nourished. No distress.  HENT:  Head: Normocephalic and atraumatic.  Neck: No JVD present. Carotid bruit is not present. No thyromegaly present.  Cardiovascular: Regular rhythm.   No murmur heard. Pulmonary/Chest: Breath sounds normal. She has no wheezes. She has no rales.  Abdominal: Soft.  Musculoskeletal: She exhibits no edema.  Neurological: She is alert and oriented to person, place, and time.  Skin: Skin is warm and dry.    ASSESSMENT:    1. Shortness of breath   2. Essential hypertension   3. Palpitations   4. Fatigue, unspecified type   5. Hyperlipidemia, unspecified hyperlipidemia type    PLAN:    In order of problems listed above:  1.  Shortness of breath  She has been somewhat short of breath.  However, this seems to be more related to deconditioning than anything.  She has also been somewhat fatigued.  Some of this may be related to stress as well.  I will obtain a comprehensive metabolic panel, CBC, TSH, BNP today.  If her BNP is elevated, she will require an echocardiogram.  I will also obtain a chest x-ray.  She knows to follow-up sooner if she continues to have issues with shortness of breath or if her symptoms should worsen.  2.  Essential hypertension  The patient's blood pressure is controlled on her current regimen.  Continue current therapy.   3.  Palpitations  Controlled on current therapy.  4.  Fatigue, unspecified type  Obtain CBC, TSH as noted above.  5.  Hyperlipidemia, unspecified hyperlipidemia type She is fasting today.  Obtain comprehensive metabolic panel and lipid panel.   Dispo:  Return in about 1  year (around 07/09/2018) for Routine Follow Up, w/ Tereso Newcomer, PA-C.   Medication Adjustments/Labs and Tests Ordered: Current medicines are reviewed at length with the patient today.  Concerns regarding medicines are outlined above.  Tests Ordered: Orders Placed This Encounter  Procedures  . DG Chest 2 View  . Comprehensive metabolic panel  . CBC  . Lipid panel  .  Pro b natriuretic peptide (BNP)  . TSH  . EKG 12-Lead   Medication Changes: Meds ordered this encounter  Medications  . metoprolol succinate (TOPROL XL) 25 MG 24 hr tablet    Sig: Take 1 tablet (25 mg total) by mouth daily.    Dispense:  90 tablet    Refill:  3    Order Specific Question:   Supervising Provider    Answer:   Jake Bathe [[id number]]    Signed, Tereso Newcomer, PA-C  07/09/2017 10:13 AM    [facility] [address] [place], [place], [place]  [address] Phone: [phone]; Fax: [phone]

## 2017-07-09 ENCOUNTER — Ambulatory Visit (INDEPENDENT_AMBULATORY_CARE_PROVIDER_SITE_OTHER): Payer: BLUE CROSS/BLUE SHIELD | Admitting: Physician Assistant

## 2017-07-09 ENCOUNTER — Telehealth: Payer: Self-pay

## 2017-07-09 ENCOUNTER — Ambulatory Visit
Admission: RE | Admit: 2017-07-09 | Discharge: 2017-07-09 | Disposition: A | Discharge status: Home / Self Care | Payer: BLUE CROSS/BLUE SHIELD | Source: Ambulatory Visit | Attending: Physician Assistant | Admitting: Physician Assistant

## 2017-07-09 ENCOUNTER — Encounter: Payer: Self-pay | Admitting: Physician Assistant

## 2017-07-09 VITALS — BP 120/80 | HR 67 | Ht 61.5 in | Wt 133.1 lb

## 2017-07-09 DIAGNOSIS — R0602 Shortness of breath: Secondary | ICD-10-CM

## 2017-07-09 DIAGNOSIS — R5383 Other fatigue: Secondary | ICD-10-CM

## 2017-07-09 DIAGNOSIS — R002 Palpitations: Secondary | ICD-10-CM | POA: Diagnosis not present

## 2017-07-09 DIAGNOSIS — I10 Essential (primary) hypertension: Secondary | ICD-10-CM

## 2017-07-09 DIAGNOSIS — E785 Hyperlipidemia, unspecified: Secondary | ICD-10-CM

## 2017-07-09 LAB — COMPREHENSIVE METABOLIC PANEL
A/G RATIO: 2.3 — AB (ref 1.2–2.2)
ALT: 19 IU/L (ref 0–32)
AST: 15 IU/L (ref 0–40)
Albumin: 4.4 g/dL (ref 3.6–4.8)
Alkaline Phosphatase: 107 IU/L (ref 39–117)
BUN/Creatinine Ratio: 15 (ref 12–28)
BUN: 10 mg/dL (ref 8–27)
Bilirubin Total: 0.2 mg/dL (ref 0.0–1.2)
CHLORIDE: 102 mmol/L (ref 96–106)
CO2: 25 mmol/L (ref 20–29)
Calcium: 9.4 mg/dL (ref 8.7–10.3)
Creatinine, Ser: 0.66 mg/dL (ref 0.57–1.00)
GFR calc Af Amer: 111 mL/min/{1.73_m2} (ref >59)
GFR calc non Af Amer: 96 mL/min/{1.73_m2} (ref >59)
Globulin, Total: 1.9 g/dL (ref 1.5–4.5)
Glucose: 88 mg/dL (ref 65–99)
POTASSIUM: 4.4 mmol/L (ref 3.5–5.2)
Sodium: 143 mmol/L (ref 134–144)
Total Protein: 6.3 g/dL (ref 6.0–8.5)

## 2017-07-09 LAB — LIPID PANEL
CHOL/HDL RATIO: 3.4 ratio (ref 0.0–4.4)
CHOLESTEROL TOTAL: 226 mg/dL — AB (ref 100–199)
HDL: 67 mg/dL (ref >39)
LDL CALC: 141 mg/dL — AB (ref 0–99)
TRIGLYCERIDES: 91 mg/dL (ref 0–149)
VLDL Cholesterol Cal: 18 mg/dL (ref 5–40)

## 2017-07-09 LAB — CBC
Hematocrit: 40.7 % (ref 34.0–46.6)
Hemoglobin: 13.8 g/dL (ref 11.1–15.9)
MCH: 30.8 pg (ref 26.6–33.0)
MCHC: 33.9 g/dL (ref 31.5–35.7)
MCV: 91 fL (ref 79–97)
PLATELETS: 277 10*3/uL (ref 150–379)
RBC: 4.48 x10E6/uL (ref 3.77–5.28)
RDW: 13.2 % (ref 12.3–15.4)
WBC: 6.9 10*3/uL (ref 3.4–10.8)

## 2017-07-09 LAB — TSH: TSH: 2.87 u[IU]/mL (ref 0.450–4.500)

## 2017-07-09 LAB — PRO B NATRIURETIC PEPTIDE: NT-PRO BNP: 76 pg/mL (ref 0–287)

## 2017-07-09 MED ORDER — METOPROLOL SUCCINATE ER 25 MG PO TB24: 25.0000 mg | ORAL_TABLET | Freq: Every day | ORAL | 3 refills | Status: DC

## 2017-07-09 NOTE — Patient Instructions (Addendum)
Medication Instructions:  No changes.  Continue current medications.  Labwork: Today - CMET, CBC, TSH, Lipids, BNP  Testing/Procedures: Go to [address] [facility]   Follow-Up: Tereso NewcomerScott Nasean Zapf, PA-C in 1 year. WE WILL SEND OUT A REMINDER LETTER IN ABOUT 9 MONTHS ; PLEASE CALL THE OFFICE THEN TO SCHEDULE YOUR APPT.   Any Other Special Instructions Will Be Listed Below (If Applicable).  If you need a refill on your cardiac medications before your next appointment, please call your pharmacy.

## 2017-07-09 NOTE — Telephone Encounter (Signed)
Spoke with patient and let her know results of chest x ray with verbal understanding. Lab results are still pending. Let her know someone will call her when those results are available. She verbalized understanding.

## 2017-07-09 NOTE — Addendum Note (Signed)
Addended by: Tonita PhoenixBOWDEN, ROBIN K on: 07/09/2017 10:27 AM   Modules accepted: Orders

## 2017-07-09 NOTE — Telephone Encounter (Signed)
-----   Message from Beatrice LecherScott T Weaver, [place]PA-C sent at 07/09/2017  3:34 PM EDT ----- Please call the patient Chest x-ray is normal.  Labs are still pending. Tereso NewcomerScott Weaver, PA-C 07/09/2017 3:34 PM

## 2017-07-24 DIAGNOSIS — K5900 Constipation, unspecified: Secondary | ICD-10-CM | POA: Diagnosis not present

## 2017-07-24 DIAGNOSIS — Z23 Encounter for immunization: Secondary | ICD-10-CM | POA: Diagnosis not present

## 2017-07-24 DIAGNOSIS — R140 Abdominal distension (gaseous): Secondary | ICD-10-CM | POA: Diagnosis not present

## 2017-08-04 DIAGNOSIS — L820 Inflamed seborrheic keratosis: Secondary | ICD-10-CM | POA: Diagnosis not present

## 2017-08-04 DIAGNOSIS — L905 Scar conditions and fibrosis of skin: Secondary | ICD-10-CM | POA: Diagnosis not present

## 2017-08-11 DIAGNOSIS — R198 Other specified symptoms and signs involving the digestive system and abdomen: Secondary | ICD-10-CM | POA: Diagnosis not present

## 2017-08-11 DIAGNOSIS — R140 Abdominal distension (gaseous): Secondary | ICD-10-CM | POA: Diagnosis not present

## 2017-08-11 DIAGNOSIS — K5900 Constipation, unspecified: Secondary | ICD-10-CM | POA: Diagnosis not present

## 2017-10-14 DIAGNOSIS — R198 Other specified symptoms and signs involving the digestive system and abdomen: Secondary | ICD-10-CM | POA: Diagnosis not present

## 2017-10-14 DIAGNOSIS — K5900 Constipation, unspecified: Secondary | ICD-10-CM | POA: Diagnosis not present

## 2017-10-14 DIAGNOSIS — R140 Abdominal distension (gaseous): Secondary | ICD-10-CM | POA: Diagnosis not present

## 2017-10-29 ENCOUNTER — Ambulatory Visit: Payer: BLUE CROSS/BLUE SHIELD | Admitting: Women's Health

## 2017-10-29 ENCOUNTER — Encounter: Payer: Self-pay | Admitting: Women's Health

## 2017-10-29 VITALS — BP 122/83 | Ht 62.0 in | Wt 131.2 lb

## 2017-10-29 DIAGNOSIS — N898 Other specified noninflammatory disorders of vagina: Secondary | ICD-10-CM | POA: Diagnosis not present

## 2017-10-29 DIAGNOSIS — Z01419 Encounter for gynecological examination (general) (routine) without abnormal findings: Secondary | ICD-10-CM

## 2017-10-29 LAB — WET PREP FOR TRICH, YEAST, CLUE

## 2017-10-29 MED ORDER — METRONIDAZOLE 0.75 % VA GEL: VAGINAL | 0 refills | Status: DC

## 2017-10-29 NOTE — Progress Notes (Signed)
[name] Jun 17, 1956 [id number]    History:    Presents for annual exam.  2007 TAH with BSO for fibroids on no HRT. Hypertension primary care manages labs and meds. Normal Pap and mammogram history. Reports white discharge with some irritation. 2009 benign colon polyp. 2007 normal DEXA. Widow, not sexually active.  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 [facility] [employer] on [facility] doing well. Parents hypertension, father deceased, mother having dementia.  ROS:  A ROS was performed and pertinent positives and negatives are included.  Exam:  Vitals:   10/29/17 1206  BP: 122/83  Weight: 131 lb 3.2 oz (59.5 kg)  Height: 5\' 2"  (1.575 m)   Body mass index is 24 kg/m.   General appearance:  Normal Thyroid:  Symmetrical, normal in size, without palpable masses or nodularity. Respiratory  Auscultation:  Clear without wheezing or rhonchi Cardiovascular  Auscultation:  Regular rate, without rubs, murmurs or gallops  Edema/varicosities:  Not grossly evident Abdominal  Soft,nontender, without masses, guarding or rebound.  Liver/spleen:  No organomegaly noted  Hernia:  None appreciated  Skin  Inspection:  Grossly normal   Breasts: Examined lying and sitting.     Right: Without masses, retractions, discharge or axillary adenopathy.     Left: Without masses, retractions, discharge or axillary adenopathy. Gentitourinary   Inguinal/mons:  Normal without inguinal adenopathy  External genitalia:  Normal  BUS/Urethra/Skene's glands:  Normal  Vagina:  Normal  Cervix: And uterus absent Adnexa/parametria:     Rt: Without masses or tenderness.   Lt: Without masses or tenderness.  Anus and perineum: Normal  Digital rectal exam: Normal sphincter tone without palpated masses or tenderness  Assessment/Plan:  62 y.o.  W WF G4 P1 for annual exam with complaint of vaginal irritation.  Bacteria vaginosis 2007 TAH with BSO-fibroids Hypertension-primary care manages labs and meds  Plan: DEXA, instructed to schedule. Reviewed importance of regular weightbearing balance type exercise. Vitamin D 2000 and calcium rich foods daily encouraged. SBE's, annual screening mammogram overdue instructed to schedule.  MetroGel vaginal cream 1 applicator at bedtime 5, alcohol precautions reviewed. Instructed to call if no relief of discharge.    Harrington Challengerancy J Isys Tietje [facility]NP, 12:09 PM 10/29/2017

## 2017-10-29 NOTE — Patient Instructions (Signed)
Health Maintenance for Postmenopausal Women Menopause is a normal process in which your reproductive ability comes to an end. This process happens gradually over a span of months to years, usually between the ages of 83 and 41. Menopause is complete when you have missed 12 consecutive menstrual periods. It is important to talk with your health care provider about some of the most common conditions that affect postmenopausal women, such as heart disease, cancer, and bone loss (osteoporosis). Adopting a healthy lifestyle and getting preventive care can help to promote your health and wellness. Those actions can also lower your chances of developing some of these common conditions. What should I know about menopause? During menopause, you may experience a number of symptoms, such as:  Moderate-to-severe hot flashes.  Night sweats.  Decrease in sex drive.  Mood swings.  Headaches.  Tiredness.  Irritability.  Memory problems.  Insomnia.  Choosing to treat or not to treat menopausal changes is an individual decision that you make with your health care provider. What should I know about hormone replacement therapy and supplements? Hormone therapy products are effective for treating symptoms that are associated with menopause, such as hot flashes and night sweats. Hormone replacement carries certain risks, especially as you become older. If you are thinking about using estrogen or estrogen with progestin treatments, discuss the benefits and risks with your health care provider. What should I know about heart disease and stroke? Heart disease, heart attack, and stroke become more likely as you age. This may be due, in part, to the hormonal changes that your body experiences during menopause. These can affect how your body processes dietary fats, triglycerides, and cholesterol. Heart attack and stroke are both medical emergencies. There are many things that you can do to help prevent heart  disease and stroke:  Have your blood pressure checked at least every 1-2 years. High blood pressure causes heart disease and increases the risk of stroke.  If you are 12-82 years old, ask your health care provider if you should take aspirin to prevent a heart attack or a stroke.  Do not use any tobacco products, including cigarettes, chewing tobacco, or electronic cigarettes. If you need help quitting, ask your health care provider.  It is important to eat a healthy diet and maintain a healthy weight. ? Be sure to include plenty of vegetables, fruits, low-fat dairy products, and lean protein. ? Avoid eating foods that are high in solid fats, added sugars, or salt (sodium).  Get regular exercise. This is one of the most important things that you can do for your health. ? Try to exercise for at least 150 minutes each week. The type of exercise that you do should increase your heart rate and make you sweat. This is known as moderate-intensity exercise. ? Try to do strengthening exercises at least twice each week. Do these in addition to the moderate-intensity exercise.  Know your numbers.Ask your health care provider to check your cholesterol and your blood glucose. Continue to have your blood tested as directed by your health care provider.  What should I know about cancer screening? There are several types of cancer. Take the following steps to reduce your risk and to catch any cancer development as early as possible. Breast Cancer  Practice breast self-awareness. ? This means understanding how your breasts normally appear and feel. ? It also means doing regular breast self-exams. Let your health care provider know about any changes, no matter how small.  If you are 40  or older, have a clinician do a breast exam (clinical breast exam or CBE) every year. Depending on your age, family history, and medical history, it may be recommended that you also have a yearly breast X-ray  (mammogram).  If you have a family history of breast cancer, talk with your health care provider about genetic screening.  If you are at high risk for breast cancer, talk with your health care provider about having an MRI and a mammogram every year.  Breast cancer (BRCA) gene test is recommended for women who have family members with BRCA-related cancers. Results of the assessment will determine the need for genetic counseling and BRCA1 and for BRCA2 testing. BRCA-related cancers include these types: ? Breast. This occurs in males or females. ? Ovarian. ? Tubal. This may also be called fallopian tube cancer. ? Cancer of the abdominal or pelvic lining (peritoneal cancer). ? Prostate. ? Pancreatic.  Cervical, Uterine, and Ovarian Cancer Your health care provider may recommend that you be screened regularly for cancer of the pelvic organs. These include your ovaries, uterus, and vagina. This screening involves a pelvic exam, which includes checking for microscopic changes to the surface of your cervix (Pap test).  For women ages 21-65, health care providers may recommend a pelvic exam and a Pap test every three years. For women ages 72-65, they may recommend the Pap test and pelvic exam, combined with testing for human papilloma virus (HPV), every five years. Some types of HPV increase your risk of cervical cancer. Testing for HPV may also be done on women of any age who have unclear Pap test results.  Other health care providers may not recommend any screening for nonpregnant women who are considered low risk for pelvic cancer and have no symptoms. Ask your health care provider if a screening pelvic exam is right for you.  If you have had past treatment for cervical cancer or a condition that could lead to cancer, you need Pap tests and screening for cancer for at least 20 years after your treatment. If Pap tests have been discontinued for you, your risk factors (such as having a new sexual  partner) need to be reassessed to determine if you should start having screenings again. Some women have medical problems that increase the chance of getting cervical cancer. In these cases, your health care provider may recommend that you have screening and Pap tests more often.  If you have a family history of uterine cancer or ovarian cancer, talk with your health care provider about genetic screening.  If you have vaginal bleeding after reaching menopause, tell your health care provider.  There are currently no reliable tests available to screen for ovarian cancer.  Lung Cancer Lung cancer screening is recommended for adults 65-82 years old who are at high risk for lung cancer because of a history of smoking. A yearly low-dose CT scan of the lungs is recommended if you:  Currently smoke.  Have a history of at least 30 pack-years of smoking and you currently smoke or have quit within the past 15 years. A pack-year is smoking an average of one pack of cigarettes per day for one year.  Yearly screening should:  Continue until it has been 15 years since you quit.  Stop if you develop a health problem that would prevent you from having lung cancer treatment.  Colorectal Cancer  This type of cancer can be detected and can often be prevented.  Routine colorectal cancer screening usually begins at  age 30 and continues through age 22.  If you have risk factors for colon cancer, your health care provider may recommend that you be screened at an earlier age.  If you have a family history of colorectal cancer, talk with your health care provider about genetic screening.  Your health care provider may also recommend using home test kits to check for hidden blood in your stool.  A small camera at the end of a tube can be used to examine your colon directly (sigmoidoscopy or colonoscopy). This is done to check for the earliest forms of colorectal cancer.  Direct examination of the colon  should be repeated every 5-10 years until age 28. However, if early forms of precancerous polyps or small growths are found or if you have a family history or genetic risk for colorectal cancer, you may need to be screened more often.  Skin Cancer  Check your skin from head to toe regularly.  Monitor any moles. Be sure to tell your health care provider: ? About any new moles or changes in moles, especially if there is a change in a mole's shape or color. ? If you have a mole that is larger than the size of a pencil eraser.  If any of your family members has a history of skin cancer, especially at a young age, talk with your health care provider about genetic screening.  Always use sunscreen. Apply sunscreen liberally and repeatedly throughout the day.  Whenever you are outside, protect yourself by wearing long sleeves, pants, a wide-brimmed hat, and sunglasses.  What should I know about osteoporosis? Osteoporosis is a condition in which bone destruction happens more quickly than new bone creation. After menopause, you may be at an increased risk for osteoporosis. To help prevent osteoporosis or the bone fractures that can happen because of osteoporosis, the following is recommended:  If you are 62-69 years old, get at least 1,000 mg of calcium and at least 600 mg of vitamin D per day.  If you are older than age 60 but younger than age 68, get at least 1,200 mg of calcium and at least 600 mg of vitamin D per day.  If you are older than age 35, get at least 1,200 mg of calcium and at least 800 mg of vitamin D per day.  Smoking and excessive alcohol intake increase the risk of osteoporosis. Eat foods that are rich in calcium and vitamin D, and do weight-bearing exercises several times each week as directed by your health care provider. What should I know about how menopause affects my mental health? Depression may occur at any age, but it is more common as you become older. Common symptoms of  depression include:  Low or sad mood.  Changes in sleep patterns.  Changes in appetite or eating patterns.  Feeling an overall lack of motivation or enjoyment of activities that you previously enjoyed.  Frequent crying spells.  Talk with your health care provider if you think that you are experiencing depression. What should I know about immunizations? It is important that you get and maintain your immunizations. These include:  Tetanus, diphtheria, and pertussis (Tdap) booster vaccine.  Influenza every year before the flu season begins.  Pneumonia vaccine.  Shingles vaccine.  Your health care provider may also recommend other immunizations. This information is not intended to replace advice given to you by your health care provider. Make sure you discuss any questions you have with your health care provider. Document Released: 10/18/2005  Document Revised: 03/15/2016 Document Reviewed: 05/30/2015 Elsevier Interactive Patient Education  2018 [employer]  Bacterial Vaginosis Bacterial vaginosis is an infection of the vagina. It happens when too many germs (bacteria) grow in the vagina. This infection puts you at risk for infections from sex (STIs). Treating this infection can lower your risk for some STIs. You should also treat this if you are pregnant. It can cause your baby to be born early. Follow these instructions at home: Medicines  Take over-the-counter and prescription medicines only as told by your doctor.  Take or use your antibiotic medicine as told by your doctor. Do not stop taking or using it even if you start to feel better. General instructions  If you your sexual partner is a woman, tell her that you have this infection. She needs to get treatment if she has symptoms. If you have a female partner, he does not need to be treated.  During treatment: ? Avoid sex. ? Do not douche. ? Avoid alcohol as told. ? Avoid breastfeeding as told.  Drink enough fluid to keep your  pee (urine) clear or pale yellow.  Keep your vagina and butt (rectum) clean. ? Wash the area with warm water every day. ? Wipe from front to back after you use the toilet.  Keep all follow-up visits as told by your doctor. This is important. Preventing this condition  Do not douche.  Use only warm water to wash around your vagina.  Use protection when you have sex. This includes: ? Latex condoms. ? Dental dams.  Limit how many people you have sex with. It is best to only have sex with the same person (be monogamous).  Get tested for STIs. Have your partner get tested.  Wear underwear that is cotton or lined with cotton.  Avoid tight pants and pantyhose. This is most important in summer.  Do not use any products that have nicotine or tobacco in them. These include cigarettes and e-cigarettes. If you need help quitting, ask your doctor.  Do not use illegal drugs.  Limit how much alcohol you drink. Contact a doctor if:  Your symptoms do not get better, even after you are treated.  You have more discharge or pain when you pee (urinate).  You have a fever.  You have pain in your belly (abdomen).  You have pain with sex.  Your bleed from your vagina between periods. Summary  This infection happens when too many germs (bacteria) grow in the vagina.  Treating this condition can lower your risk for some infections from sex (STIs).  You should also treat this if you are pregnant. It can cause early (premature) birth.  Do not stop taking or using your antibiotic medicine even if you start to feel better. This information is not intended to replace advice given to you by your health care provider. Make sure you discuss any questions you have with your health care provider. Document Released: 06/04/2008 Document Revised: 05/11/2016 Document Reviewed: 05/11/2016 Elsevier Interactive Patient Education  2017 Elsevier Inc.  

## 2017-11-21 IMAGING — MG MM SCREENING BREAST TOMO BILATERAL
8 series · 9 of 24 positions shown · non-contrast
Comparison: Previous exam(s).

CLINICAL DATA: Screening.

EXAM:
DIGITAL SCREENING BILATERAL MAMMOGRAM WITH 3D TOMO WITH CAD

[L MLO]
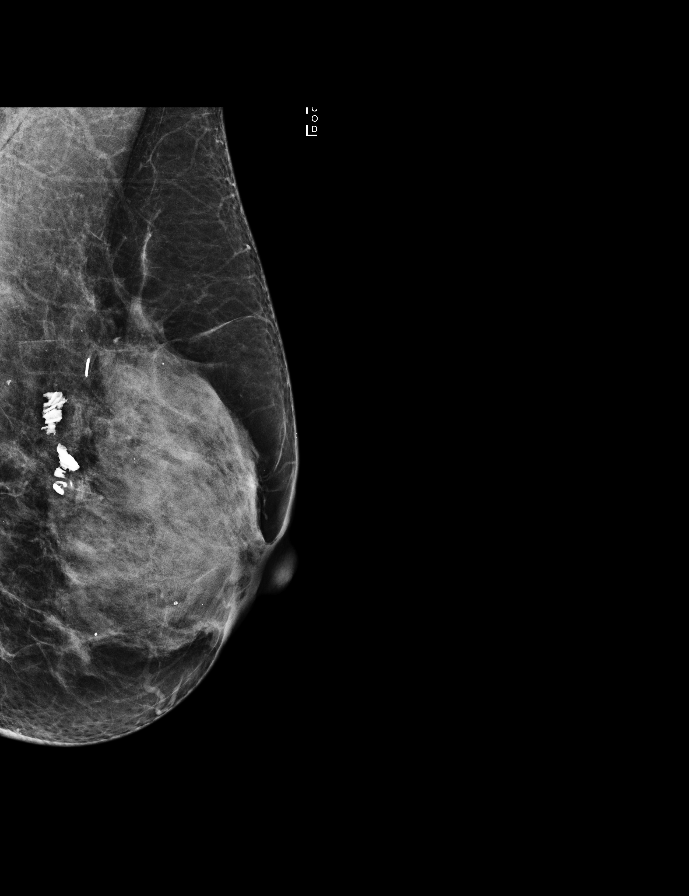

[R MLO]
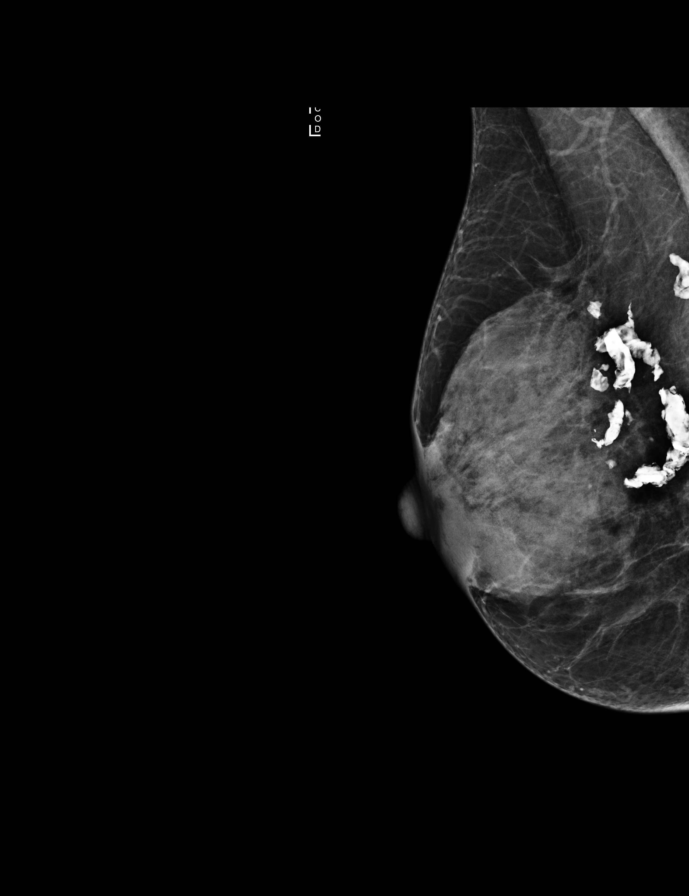

[R CC]
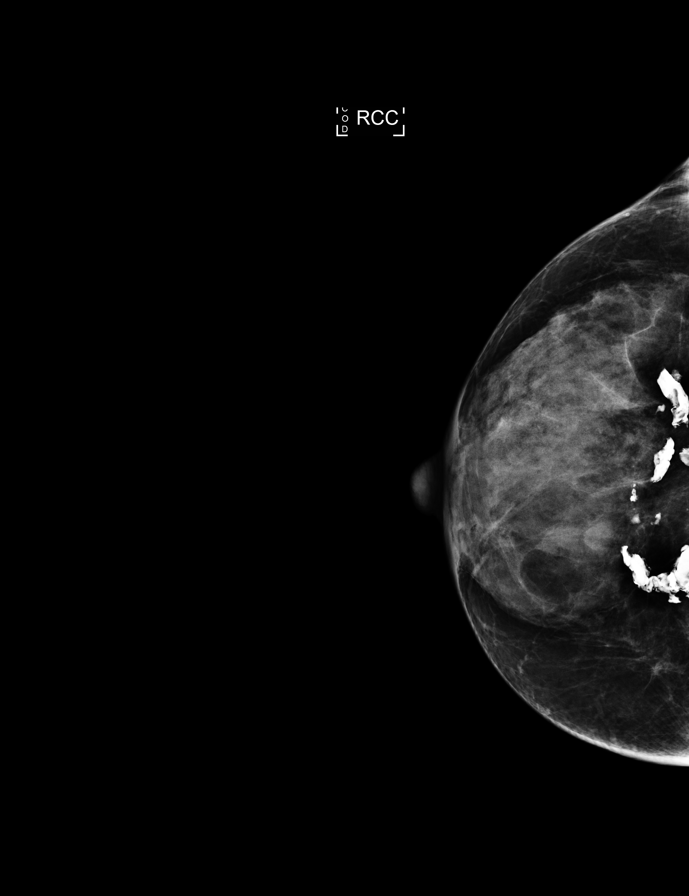

[L CC]
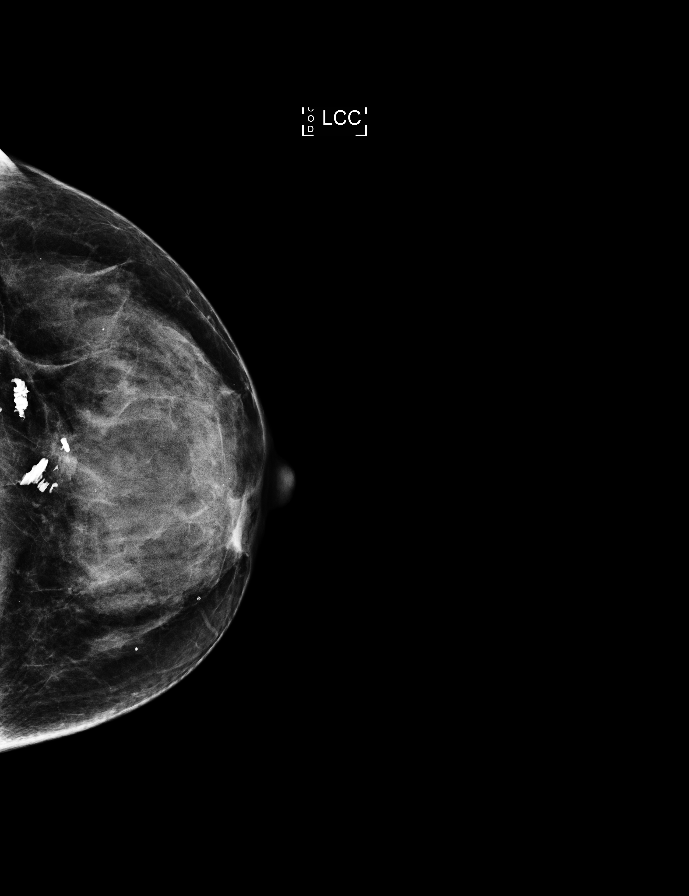

[R MLO tomo · 2 of 52 frames shown]
[frame 17/52]
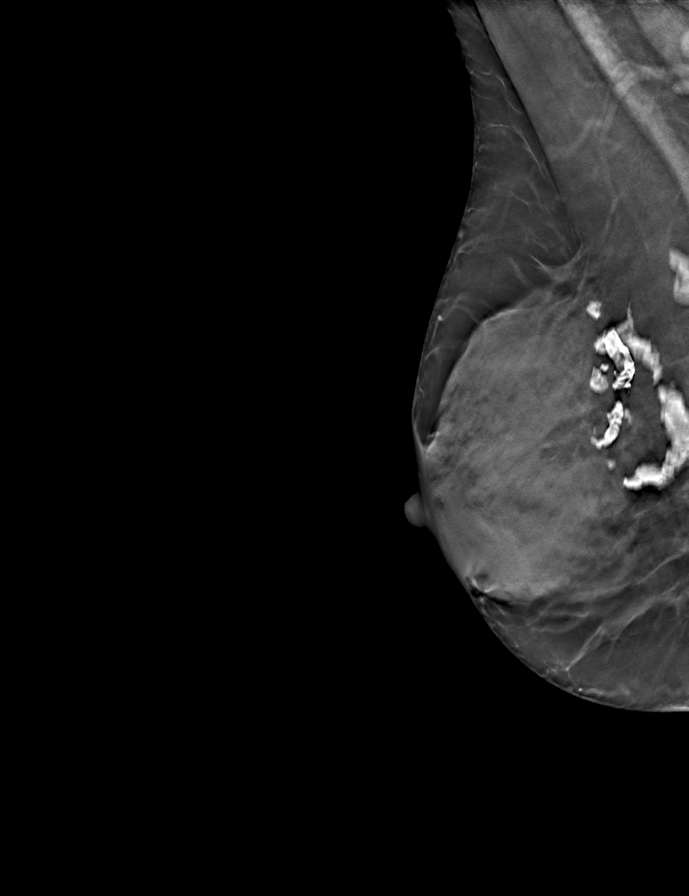
[frame 27/52]
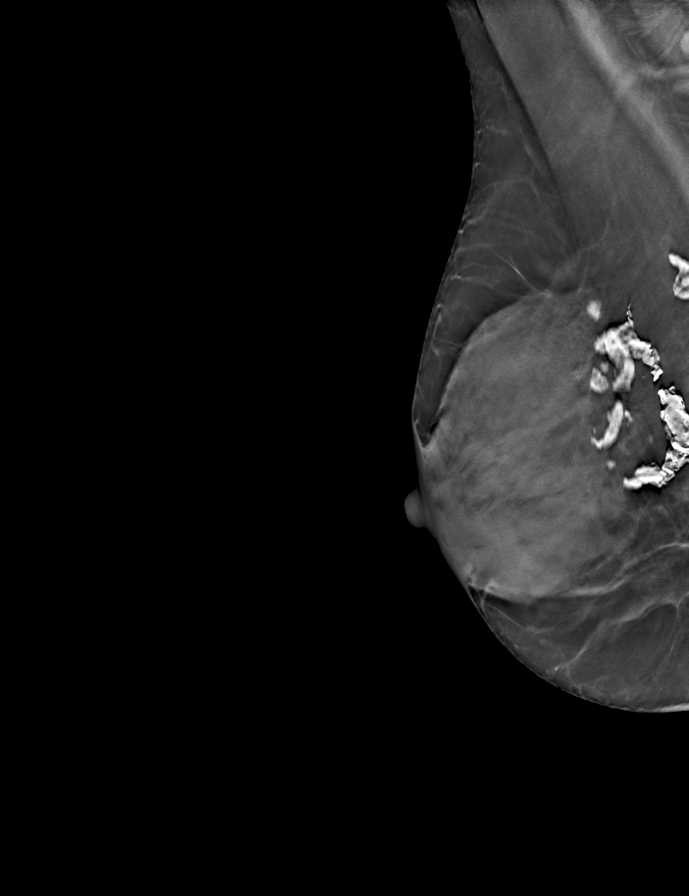

[L CC tomo · tomo slice 28/55.0]
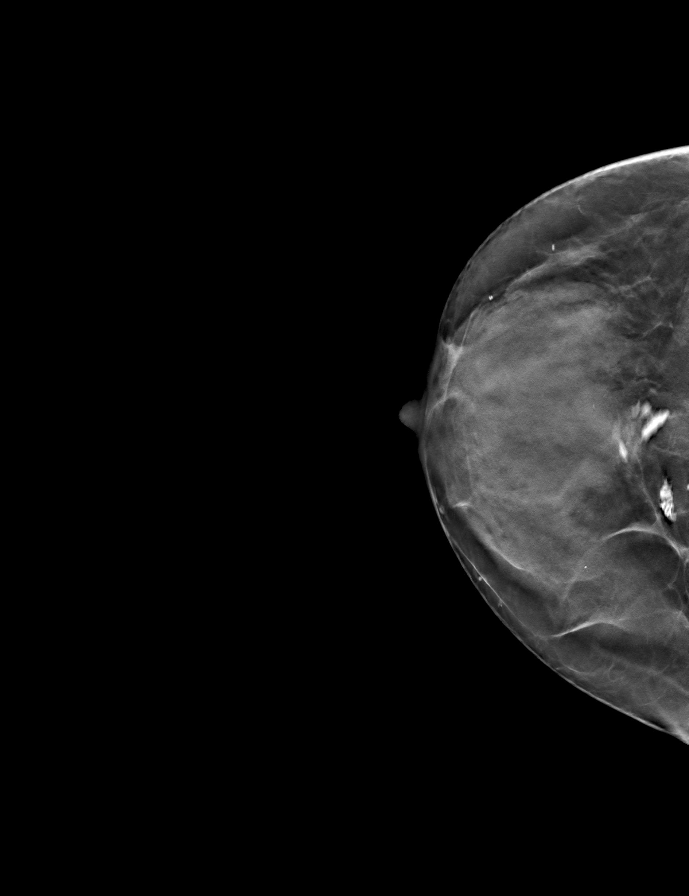

[R CC tomo · tomo slice 28/55.0]
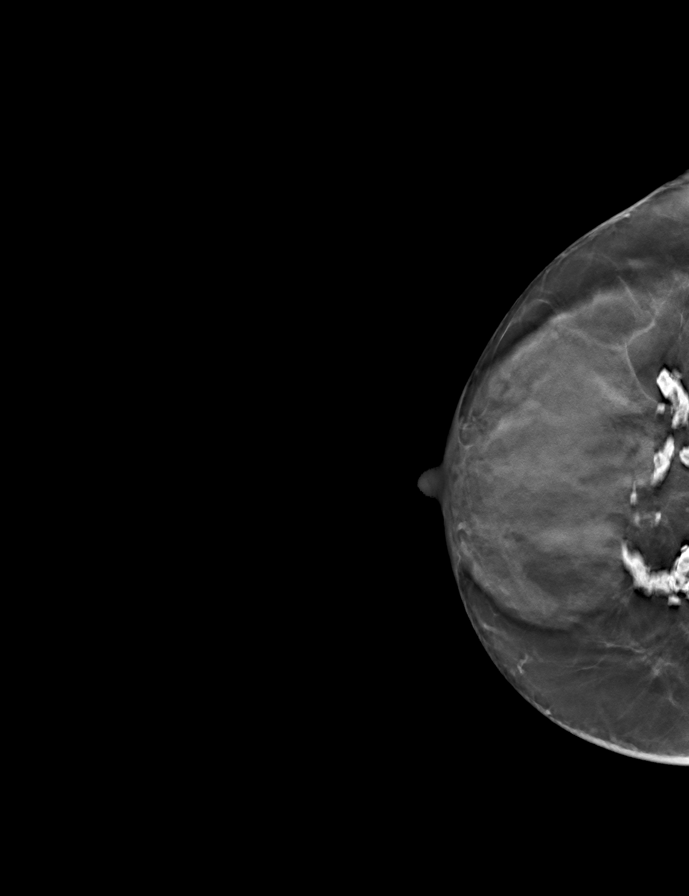

[L MLO tomo · tomo slice 27/52.0]
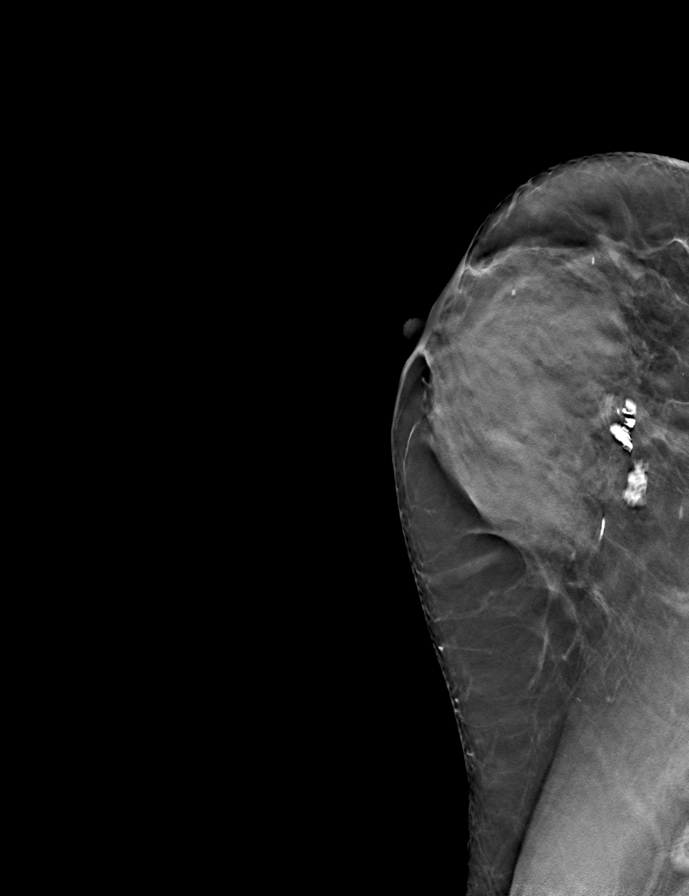

[9 of 24 positions shown; findings below may reference images not displayed]

ACR Breast Density Category d: The breast tissue is extremely dense,
which lowers the sensitivity of mammography.
FINDINGS: There are no findings suspicious for malignancy. Images were
processed with CAD.
IMPRESSION: No mammographic evidence of malignancy. A result letter of this
screening mammogram will be mailed directly to the patient.

RECOMMENDATION:
Screening mammogram in one year. (Code:JD-S-SN3)

BI-RADS CATEGORY  1: Negative.

## 2017-11-25 ENCOUNTER — Ambulatory Visit: Payer: BLUE CROSS/BLUE SHIELD | Admitting: Physician Assistant

## 2017-11-25 ENCOUNTER — Encounter: Payer: Self-pay | Admitting: Physician Assistant

## 2017-11-25 VITALS — BP 134/88 | HR 78 | Ht 61.5 in | Wt 128.1 lb

## 2017-11-25 DIAGNOSIS — I10 Essential (primary) hypertension: Secondary | ICD-10-CM | POA: Diagnosis not present

## 2017-11-25 DIAGNOSIS — Z8249 Family history of ischemic heart disease and other diseases of the circulatory system: Secondary | ICD-10-CM | POA: Diagnosis not present

## 2017-11-25 DIAGNOSIS — E7800 Pure hypercholesterolemia, unspecified: Secondary | ICD-10-CM

## 2017-11-25 MED ORDER — METOPROLOL SUCCINATE ER 25 MG PO TB24: 12.5000 mg | ORAL_TABLET | ORAL | Status: DC

## 2017-11-25 NOTE — Patient Instructions (Addendum)
Medication Instructions:  1. DECREASE TOPROL XL TO 1/2 TABLET = 12.5 MG FOR 3 DAYS THEN STOP  Labwork: NONE ORDERED TODAY  Testing/Procedures: YOU WILL NEED TO BE SCHEDULED FOR A CARDAIC CALCIUM CT SCORE  Follow-Up: Your physician recommends that you schedule a follow-up appointment in: 6 WEEKS [facility]    Any Other Special Instructions Will Be Listed Below (If Applicable). MONITOR BP 3-4 TIMES WEEKLY; BRING READINGS TO NEXT APPT    If you need a refill on your cardiac medications before your next appointment, please call your pharmacy.

## 2017-11-25 NOTE — Progress Notes (Signed)
Cardiology Office Note:    Date:  11/25/2017   ID:  [name], DOB 07-27-1956, MRN [id number]  PCP:  Linda GrippeKim, James, MD  Cardiologist:  Linda SchultzMark Skains, MD   Referring MD: Linda GrippeKim, James, MD   Chief Complaint  Patient presents with  . Follow-up    BP, FHx of CAD    History of Present Illness:    [name] is a 62 y.o. female with hypertension, hyperlipidemia, palpitations.  Last seen October 2018.  Ms. [name] returns to discuss her blood pressure medication as well as to discuss her FHx of CAD.  She is here alone.  She notes that since she started taking Toprol-XL, she has had temperature intolerance to heat.  She is s/p oophorectomy years ago and recent TSH was normal. She also notes her mother had a large MI recently.  She is being considered for Hospice.  Ms. [name] is concerned about her risk for myocardial infarction.  She has a 10 year ASCVD risk of 4.7% and is not on statin Rx.  She denies chest pain, shortness of breath, syncope.    Prior CV studies:   The following studies were reviewed today:  Event monitor 6/15 NSR, rare PVC, no arrhythmia  Myoview 3/12 No ischemia, EF 50%, normal study  Past Medical History:  Diagnosis Date  . Anxiety   . Depression   . Hyperlipidemia   . Polyp of colon 2009   benign  . PVC's (premature ventricular contractions)    a. prior event monitor 02/2014: NSR, rare PVC.  Marland Kitchen. SAB (spontaneous abortion)   . Stress    situational  . Trisomy 18    prior pregnancy with Trisomy 18   Surgical Hx: The patient  has a past surgical history that includes Myomectomy; Carpal tunnel release (Right, 2004); Cesarean section; and Total abdominal hysterectomy w/ bilateral salpingoophorectomy (03/04/2006).   Current Medications: Current Meds  Medication Sig  . aspirin 81 MG tablet Take 81 mg by mouth daily.  . Biotin 5000 MCG TABS TAKE 2 TABLETS BY MOUTH DAILY  . Calcium Carbonate-Vitamin D (CALCIUM + D PO) Take 1 tablet by  mouth daily.   . Cyanocobalamin (VITAMIN B 12 PO) Take 500 mcg by mouth daily.  . folic acid (FOLVITE) 1 MG tablet Take 400 mcg by mouth daily.  Marland Kitchen. MAGNESIUM CARBONATE PO Take 1 tablet by mouth daily.  . metroNIDAZOLE (METROGEL VAGINAL) 0.75 % vaginal gel 1 applicator per vagina at HS x 5  . Multiple Vitamin (MULTIVITAMIN) tablet Take 2 tablets by mouth daily.   . Red Yeast Rice Extract (RED YEAST RICE PO) Take 2 capsules by mouth daily.   . [DISCONTINUED] metoprolol succinate (TOPROL XL) 25 MG 24 hr tablet Take 1 tablet (25 mg total) by mouth daily.     Allergies:   Amoxicillin; Codeine; Megace [megestrol]; and Morphine and related   Social History   Tobacco Use  . Smoking status: Never Smoker  . Smokeless tobacco: Never Used  Substance Use Topics  . Alcohol use: No  . Drug use: No     Family Hx: The patient's family history includes Atrial fibrillation in her father; Heart attack (age of onset: 4284) in her mother; Heart failure in her father; Hypertension in her father and mother; Lung cancer in her father. There is no history of Stroke.  ROS:   Please see the history of present illness.    ROS All other systems reviewed and are negative.   EKGs/Labs/Other  Test Reviewed:    EKG:  EKG is  ordered today.  The ekg ordered today demonstrates normal sinus rhythm, heart rate 78, normal axis, QTC 449  Recent Labs: 07/09/2017: ALT 19; BUN 10; Creatinine, Ser 0.66; Hemoglobin 13.8; NT-Pro BNP 76; Platelets 277; Potassium 4.4; Sodium 143; TSH 2.870   Recent Lipid Panel Lab Results  Component Value Date/Time   CHOL 226 (H) 07/09/2017 10:27 AM   TRIG 91 07/09/2017 10:27 AM   HDL 67 07/09/2017 10:27 AM   CHOLHDL 3.4 07/09/2017 10:27 AM   CHOLHDL 4 06/21/2013 09:24 AM   LDLCALC 141 (H) 07/09/2017 10:27 AM   LDLDIRECT 160.4 06/21/2013 09:24 AM    Physical Exam:    VS:  BP 134/88   Pulse 78   Ht 5' 1.5" (1.562 m)   Wt 128 lb 1.9 oz (58.1 kg)   SpO2 95%   BMI 23.82 kg/m      Wt Readings from Last 3 Encounters:  11/25/17 128 lb 1.9 oz (58.1 kg)  10/29/17 131 lb 3.2 oz (59.5 kg)  07/09/17 133 lb 1.9 oz (60.4 kg)     Physical Exam  Constitutional: She is oriented to person, place, and time. She appears well-developed and well-nourished. No distress.  HENT:  Head: Normocephalic and atraumatic.  Neck: No JVD present. Carotid bruit is not present.  Cardiovascular: Normal rate and regular rhythm.  No murmur heard. Pulmonary/Chest: Effort normal. She has no rales.  Abdominal: Soft.  Musculoskeletal: She exhibits no edema.  Neurological: She is alert and oriented to person, place, and time.  Skin: Skin is warm and dry.    ASSESSMENT & PLAN:    #1.  Essential hypertension She is on a very small dose of beta-blocker for her blood pressure and medication was started when her husband was terminally ill. She attributed her high BP to anxiety and stress.  She is trying to exercise more and is eating very well with a low sodium diet.  She is concerned that her heat intolerance is related to the beta-blocker.  Her side effects are not likely related to the Toprol.  But, I think it is reasonable to try her off of medication to see if her blood pressure remains controlled.  -Taper off of Toprol x 3 days, then stop  -Monitor BP over next several weeks   -Bring readings to next OV  -If side effects do not resolve and BP increases above target, resume Toprol-XL  #2.  Family history of acute anterior wall MI  She is concerned about her risk for coronary artery disease and having a myocardial infarction.  As noted, her 10-year risk is low at 4.7%.  We discussed the pros and cons of obtaining a calcium score.   I cautioned her that since she is 36, she may have some calcification already.  However, if her score is significantly elevated, this may convince [place] to start her on statin therapy as well as to continue aspirin.  - Plan: CT CARDIAC SCORING  -If score low, stop  aspirin  -If score high, continue aspirin and start statin therapy  #3.  Hypercholesterolemia Continue current management.  Obtain calcium score as noted above.  Dispo:  Return in about 6 weeks (around 01/06/2018) for Routine Follow Up, w/ Tereso Newcomer, PA-C.  She requests seeing Dr. Anne Fu.  Arrange routine follow-up after next visit.   Medication Adjustments/Labs and Tests Ordered: Current medicines are reviewed at length with the patient today.  Concerns regarding medicines are outlined  above.  Tests Ordered: Orders Placed This Encounter  Procedures  . CT CARDIAC SCORING  . EKG 12-Lead   Medication Changes: Meds ordered this encounter  Medications  . metoprolol succinate (TOPROL XL) 25 MG 24 hr tablet    Sig: Take 0.5 tablets (12.5 mg total) by mouth as directed. 1/2 TAB DAILY FOR 3 DAYS THEN STOP    Signed, Tereso Newcomer, PA-C  11/25/2017 5:18 PM    [facility] [address] [place], [place], [place]  [address] Phone: [phone]; Fax: [phone]

## 2017-12-02 ENCOUNTER — Ambulatory Visit (INDEPENDENT_AMBULATORY_CARE_PROVIDER_SITE_OTHER)
Admission: RE | Admit: 2017-12-02 | Discharge: 2017-12-02 | Disposition: A | Discharge status: Home / Self Care | Payer: Self-pay | Source: Ambulatory Visit | Attending: Physician Assistant | Admitting: Physician Assistant

## 2017-12-02 DIAGNOSIS — Z8249 Family history of ischemic heart disease and other diseases of the circulatory system: Secondary | ICD-10-CM

## 2017-12-03 ENCOUNTER — Encounter: Payer: Self-pay | Admitting: Physician Assistant

## 2018-01-05 ENCOUNTER — Telehealth: Payer: Self-pay | Admitting: Physician Assistant

## 2018-01-05 NOTE — Telephone Encounter (Signed)
Called patient and left message to call back. Will forward to [facility] PA and his nurse.

## 2018-01-05 NOTE — Telephone Encounter (Signed)
Pt c/o medication issue:  1. Name of Medication:  metoprolol succinate (TOPROL XL) 25 MG 24 hr tablet    2. How are you currently taking this medication (dosage and times per day)?Take 0.5 tablets (12.5 mg total) by mouth as directed. 1/2 TAB DAILY FOR 3 DAYS THEN STOP  3. Are you having a reaction (difficulty breathing--STAT)? no 4. What is your medication issue? Pt stated that she will discontinue this medication, she didn't go into detail as to why   She just want provider to be aware

## 2018-01-05 NOTE — Telephone Encounter (Signed)
Ok.  At last office visit, I did recommend tapering off of Metoprolol Succinate and stopping it. Tereso Newcomer, PA-C    01/05/2018 9:32 PM

## 2018-01-06 ENCOUNTER — Ambulatory Visit: Payer: BLUE CROSS/BLUE SHIELD | Admitting: Physician Assistant

## 2018-06-12 ENCOUNTER — Ambulatory Visit: Payer: BLUE CROSS/BLUE SHIELD | Admitting: Cardiology

## 2018-06-12 ENCOUNTER — Encounter (INDEPENDENT_AMBULATORY_CARE_PROVIDER_SITE_OTHER): Payer: Self-pay

## 2018-06-12 ENCOUNTER — Encounter: Payer: Self-pay | Admitting: Cardiology

## 2018-06-12 VITALS — BP 128/76 | HR 68 | Ht 61.5 in | Wt 133.8 lb

## 2018-06-12 DIAGNOSIS — E7800 Pure hypercholesterolemia, unspecified: Secondary | ICD-10-CM | POA: Diagnosis not present

## 2018-06-12 DIAGNOSIS — Z8249 Family history of ischemic heart disease and other diseases of the circulatory system: Secondary | ICD-10-CM

## 2018-06-12 DIAGNOSIS — I10 Essential (primary) hypertension: Secondary | ICD-10-CM

## 2018-06-12 NOTE — Patient Instructions (Signed)
Your physician has recommended you make the following change in your medication:  ASPIRIN 81 MG EVERY OTHER DAY  DECREASE METOPROLOL TO 12.5 MG DAILY FOR 3 DAYS THEN STOP  Your physician wants you to follow-up in:  6 MONTHS WITH DR Anne Fu  You will receive a reminder letter in the mail two months in advance. If you don't receive a letter, please call our office to schedule the follow-up appointment.  Your physician has requested that you regularly monitor and record your blood pressure readings at home. Please use the same machine at the same time of day to check your readings and record them to bring to your follow-up visit.  IF B/P GOES UP TO THE 130'S OR GREATER  CALL OFFICE

## 2018-06-12 NOTE — Progress Notes (Signed)
Cardiology Office Note:    Date:  06/12/2018   ID:  [name], DOB 1955/12/04, MRN [id number]  PCP:  Pearson Grippe, MD  Cardiologist:  Donato Schultz, MD  Electrophysiologist:  None   Referring MD: Pearson Grippe, MD     History of Present Illness:    [name] is a 62 y.o. female former patient of Dr. Yevonne Pax seen by Tereso Newcomer on 11/25/2017 for hypertension and family history of coronary artery disease here for follow-up.  In review of prior note she stated that since she was taking Toprol she began to have temperature intolerance to heat, status post oophorectomy many years ago, TSH was normal, mother had large MI recently consideration for hospice.  Her 10-year ASCVD risk is 4.7% and is currently not on statin.  She was quite anxious under a lot of stress when she saw Norma Fredrickson at one point in the midst of her husband with pancreatic cancer who is subsequently passed in her mother with recent MI death.  She is a Interior and spatial designer. Denies any symptoms of fevers chills nausea vomiting syncope bleeding chest pain orthopnea.  Past Medical History:  Diagnosis Date  . Anxiety   . Depression   . History of CT scan    Coronary calcium CT 3/19: Calcium score 0  . Hyperlipidemia   . Polyp of colon 2009   benign  . PVC's (premature ventricular contractions)    a. prior event monitor 02/2014: NSR, rare PVC.  Marland Kitchen SAB (spontaneous abortion)   . Stress    situational  . Trisomy 18    prior pregnancy with Trisomy 18    Past Surgical History:  Procedure Laterality Date  . CARPAL TUNNEL RELEASE Right 2004  . CESAREAN SECTION     x 2 ( 1 with trisomy 18)  . MYOMECTOMY    . TOTAL ABDOMINAL HYSTERECTOMY W/ BILATERAL SALPINGOOPHORECTOMY  03/04/2006    Current Medications: Current Meds  Medication Sig  . aspirin 81 MG tablet Take 81 mg by mouth daily.  . Biotin 5000 MCG TABS TAKE 2 TABLETS BY MOUTH DAILY  . Calcium Carbonate-Vitamin D (CALCIUM + D PO) Take 1 tablet by  mouth daily.   . Cyanocobalamin (VITAMIN B 12 PO) Take 500 mcg by mouth daily.  . folic acid (FOLVITE) 1 MG tablet Take 400 mcg by mouth daily.  Marland Kitchen MAGNESIUM CARBONATE PO Take 1 tablet by mouth daily.  . metoprolol succinate (TOPROL-XL) 25 MG 24 hr tablet Take 25 mg by mouth daily.  . [DISCONTINUED] metoprolol succinate (TOPROL XL) 25 MG 24 hr tablet Take 0.5 tablets (12.5 mg total) by mouth as directed. 1/2 TAB DAILY FOR 3 DAYS THEN STOP (Patient taking differently: Take 25 mg by mouth daily. 1/2 TAB DAILY FOR 3 DAYS THEN STOP)  . [DISCONTINUED] metroNIDAZOLE (METROGEL VAGINAL) 0.75 % vaginal gel 1 applicator per vagina at HS x 5  . [DISCONTINUED] Multiple Vitamin (MULTIVITAMIN) tablet Take 2 tablets by mouth daily.      Allergies:   Amoxicillin; Codeine; Megace [megestrol]; and Morphine and related   Social History   Socioeconomic History  . Marital status: Widowed    Spouse name: Not on file  . Number of children: Not on file  . Years of education: Not on file  . Highest education level: Not on file  Occupational History  . Not on file  Social Needs  . Financial resource strain: Not on file  . Food insecurity:    Worry: Not on file  Inability: Not on file  . Transportation needs:    Medical: Not on file    Non-medical: Not on file  Tobacco Use  . Smoking status: Never Smoker  . Smokeless tobacco: Never Used  Substance and Sexual Activity  . Alcohol use: No  . Drug use: No  . Sexual activity: Never  Lifestyle  . Physical activity:    Days per week: Not on file    Minutes per session: Not on file  . Stress: Not on file  Relationships  . Social connections:    Talks on phone: Not on file    Gets together: Not on file    Attends religious service: Not on file    Active member of club or organization: Not on file    Attends meetings of clubs or organizations: Not on file    Relationship status: Not on file  Other Topics Concern  . Not on file  Social History  Narrative  . Not on file     Family History: The patient's family history includes Atrial fibrillation in her father; Heart attack (age of onset: 41) in her mother; Heart failure in her father; Hypertension in her father and mother; Lung cancer in her father. There is no history of Stroke.  ROS:   Please see the history of present illness.     All other systems reviewed and are negative.  EKGs/Labs/Other Studies Reviewed:    The following studies were reviewed today:  Event monitor from June 2015 showed normal sinus rhythm with rare PVCs but no adverse arrhythmias  Nuclear stress test from March 2012 showed no ischemia normal EF.  Coronary calcium score 12/02/2017-0.  Mild aortic atherosclerosis noted distally.  EKG:  EKG is not ordered today. prior ECG showed sinus rhythm 78 QTc 449  Recent Labs: 07/09/2017: ALT 19; BUN 10; Creatinine, Ser 0.66; Hemoglobin 13.8; NT-Pro BNP 76; Platelets 277; Potassium 4.4; Sodium 143; TSH 2.870  Recent Lipid Panel    Component Value Date/Time   CHOL 226 (H) 07/09/2017 1027   TRIG 91 07/09/2017 1027   HDL 67 07/09/2017 1027   CHOLHDL 3.4 07/09/2017 1027   CHOLHDL 4 06/21/2013 0924   VLDL 26.0 06/21/2013 0924   LDLCALC 141 (H) 07/09/2017 1027   LDLDIRECT 160.4 06/21/2013 0924    Physical Exam:    VS:  BP 128/76   Pulse 68   Ht 5' 1.5" (1.562 m)   Wt 133 lb 12.8 oz (60.7 kg)   SpO2 99%   BMI 24.87 kg/m     Wt Readings from Last 3 Encounters:  06/12/18 133 lb 12.8 oz (60.7 kg)  11/25/17 128 lb 1.9 oz (58.1 kg)  10/29/17 131 lb 3.2 oz (59.5 kg)     GEN:  Well nourished, well developed in no acute distress HEENT: Normal NECK: No JVD; No carotid bruits LYMPHATICS: No lymphadenopathy CARDIAC: RRR, no murmurs, rubs, gallops RESPIRATORY:  Clear to auscultation without rales, wheezing or rhonchi  ABDOMEN: Soft, non-tender, non-distended MUSCULOSKELETAL:  No edema; No deformity  SKIN: Warm and dry NEUROLOGIC:  Alert and oriented x  3 PSYCHIATRIC:  Normal affect   ASSESSMENT:    1. Essential hypertension   2. Family history of acute anterior wall MI   3. Hypercholesterolemia    PLAN:    In order of problems listed above:  Family history of coronary artery disease - Ten-year risk score is 4.7%. -Coronary calcium score was 0.  12/02/2017 she did have some mild aortic atherosclerosis seen  on CT.  Because of the reassuring calcium score, she not advised to start statin.  Very encouraging news.  Note, LDL 141, HDL 67  Very mild aortic atherosclerosis - Personally reviewed CT and showed her.  Small speck x2 of calcium in the descending aorta.  Most aggressive therapy would be to start statin.  We will however continue with aspirin 81 mg every other day, watch for bleeding and continue with diet and exercise.  Plant-based.  Hyperlipidemia - Reassuring coronary calcium score.  Continue with diet and exercise.  Family history of CAD -Her mother died of heart attack.  I saw her in the emergency room.  Unfortunately, she also lost her husband to pancreatic cancer and her father as well.  Essential hypertension - Very low-dose beta-blocker previously used, tapered off Toprol.  She will continue to monitor her blood pressures and if she is in the 140 systolic or upper 130s, we will likely add back in agent to help her.  Continue to maintain weight. -Blood pressure readings reviewed.  Medication Adjustments/Labs and Tests Ordered: Current medicines are reviewed at length with the patient today.  Concerns regarding medicines are outlined above.  No orders of the defined types were placed in this encounter.  No orders of the defined types were placed in this encounter.   Patient Instructions  Your physician has recommended you make the following change in your medication:  ASPIRIN 81 MG EVERY OTHER DAY  DECREASE METOPROLOL TO 12.5 MG DAILY FOR 3 DAYS THEN STOP  Your physician wants you to follow-up in:  6 MONTHS WITH DR  Algis Liming will receive a reminder letter in the mail two months in advance. If you don't receive a letter, please call our office to schedule the follow-up appointment.  Your physician has requested that you regularly monitor and record your blood pressure readings at home. Please use the same machine at the same time of day to check your readings and record them to bring to your follow-up visit.  IF B/P GOES UP TO THE 130'S OR GREATER  CALL OFFICE     Signed, Donato Schultz, MD  06/12/2018 9:32 AM    [facility]

## 2018-06-14 DIAGNOSIS — M79671 Pain in right foot: Secondary | ICD-10-CM | POA: Diagnosis not present

## 2018-06-16 ENCOUNTER — Ambulatory Visit (INDEPENDENT_AMBULATORY_CARE_PROVIDER_SITE_OTHER): Payer: Self-pay

## 2018-06-16 ENCOUNTER — Encounter (INDEPENDENT_AMBULATORY_CARE_PROVIDER_SITE_OTHER): Payer: Self-pay | Admitting: Orthopaedic Surgery

## 2018-06-16 ENCOUNTER — Ambulatory Visit (INDEPENDENT_AMBULATORY_CARE_PROVIDER_SITE_OTHER): Payer: BLUE CROSS/BLUE SHIELD | Admitting: Orthopaedic Surgery

## 2018-06-16 DIAGNOSIS — M79671 Pain in right foot: Secondary | ICD-10-CM | POA: Diagnosis not present

## 2018-06-16 NOTE — Progress Notes (Signed)
Office Visit Note   Patient: [name]           Date of Birth: 02-15-1956           MRN: [id number] Visit Date: 06/16/2018              Requested by: Pearson Grippe, MD [address] [place], [place] [address] PCP: Pearson Grippe, MD   Assessment & Plan: Visit Diagnoses:  1. Pain in right foot     Plan: Impression is right foot contusion.  Explained to the patient expected symptoms over the next few months.  She will try a short cam walker to see if this will give her some support.  Wean this as tolerated.  Ice and heat and elevation and rest as indicated.  Over-the-counter pain medicines as needed.  Follow-up as needed.  Follow-Up Instructions: Return if symptoms worsen or fail to improve.   Orders:  Orders Placed This Encounter  Procedures  . XR Foot Complete Right   No orders of the defined types were placed in this encounter.     Procedures: No procedures performed   Clinical Data: No additional findings.   Subjective: Chief Complaint  Patient presents with  . Right Foot - Pain    [name] is a very pleasant 62 year old female comes in with acute right foot injury from this weekend.  She had a couple of shells fall directly onto her midfoot.  These were quite heavy.  She has had pain and swelling and bruising since then.  She does endorse some numbness in her toes.  She denies any motor deficits.   Review of Systems  Constitutional: Negative.   HENT: Negative.   Eyes: Negative.   Respiratory: Negative.   Cardiovascular: Negative.   Endocrine: Negative.   Musculoskeletal: Negative.   Neurological: Negative.   Hematological: Negative.   Psychiatric/Behavioral: Negative.   All other systems reviewed and are negative.    Objective: Vital Signs: There were no vitals taken for this visit.  Physical Exam  Constitutional: She is oriented to person, place, and time. She appears well-developed and well-nourished.  HENT:  Head: Normocephalic  and atraumatic.  Eyes: EOM are normal.  Neck: Neck supple.  Pulmonary/Chest: Effort normal.  Abdominal: Soft.  Neurological: She is alert and oriented to person, place, and time.  Skin: Skin is warm. Capillary refill takes less than 2 seconds.  Psychiatric: She has a normal mood and affect. Her behavior is normal. Judgment and thought content normal.  Nursing note and vitals reviewed.   Ortho Exam Right foot exam shows moderate ecchymosis and swelling patient on the dorsum.  I do not appreciate any ecchymosis in the arch or on the plantar aspect of the foot.  This is neurovascularly intact.  She has no focal motor or sensory deficits. Specialty Comments:  No specialty comments available.  Imaging: Xr Foot Complete Right  Result Date: 06/16/2018 No acute abnormalities to suggest Lisfranc injury.  Advanced hallux rigidus with periarticular spurring.    PMFS History: Patient Active Problem List   Diagnosis Date Noted  . Palpitations 07/08/2017  . Menopause 03/11/2012  . Tiredness 03/11/2012  . Pain of left hand 10/15/2011  . HTN (hypertension) 05/21/2011  . Stress   . Hypercholesterolemia 04/09/2010  . COLONIC POLYPS, HX OF 04/09/2010   Past Medical History:  Diagnosis Date  . Anxiety   . Depression   . History of CT scan    Coronary calcium CT 3/19: Calcium score 0  .  Hyperlipidemia   . Polyp of colon 2009   benign  . PVC's (premature ventricular contractions)    a. prior event monitor 02/2014: NSR, rare PVC.  Marland Kitchen SAB (spontaneous abortion)   . Stress    situational  . Trisomy 18    prior pregnancy with Trisomy 47    Family History  Problem Relation Age of Onset  . Hypertension Mother   . Heart attack Mother 59  . Hypertension Father   . Atrial fibrillation Father   . Lung cancer Father        LUNG  . Heart failure Father   . Stroke Neg Hx     Past Surgical History:  Procedure Laterality Date  . CARPAL TUNNEL RELEASE Right 2004  . CESAREAN SECTION     x 2  ( 1 with trisomy 18)  . MYOMECTOMY    . TOTAL ABDOMINAL HYSTERECTOMY W/ BILATERAL SALPINGOOPHORECTOMY  03/04/2006   Social History   Occupational History  . Not on file  Tobacco Use  . Smoking status: Never Smoker  . Smokeless tobacco: Never Used  Substance and Sexual Activity  . Alcohol use: No  . Drug use: No  . Sexual activity: Never

## 2018-09-08 DIAGNOSIS — H43811 Vitreous degeneration, right eye: Secondary | ICD-10-CM | POA: Diagnosis not present

## 2018-09-09 DIAGNOSIS — M549 Dorsalgia, unspecified: Secondary | ICD-10-CM | POA: Diagnosis not present

## 2018-09-10 ENCOUNTER — Telehealth: Payer: Self-pay

## 2018-09-10 NOTE — Telephone Encounter (Signed)
From 06/12/2018 Dr Donato Schultz office visit note: Essential hypertension - Very low-dose beta-blocker previously used, tapered off Toprol.  She will continue to monitor her blood pressures and if she is in the 140 systolic or upper 130s, we will likely add back in agent to help her.  Continue to maintain weight. -Blood pressure readings reviewed.

## 2018-09-10 NOTE — Telephone Encounter (Signed)
Left message for pt to call back to discuss her concerns r/t pain and her BP.

## 2018-09-10 NOTE — Telephone Encounter (Signed)
Lm to cb to discuss concerns 

## 2018-09-10 NOTE — Telephone Encounter (Signed)
Patient walked in, she was having pain in her left shoulder blade, she said it was painful to breathe in. She denied SOB or chest pain. She also has not taken any pain medication to help. Last night she tried to take a hot shower to help, however she still was still hurting. She went to Urgent Care yesterday and they check her out and said she had a pulled muscle, they performed and EKG did not have any concerns. Advised her to go to PCP to be advised or hospital if she feels the symptoms are worsening. She also brought up another concern that her blood pressure was 150's systolic for the past few days.

## 2018-09-10 NOTE — Telephone Encounter (Signed)
Patient called back

## 2018-09-15 NOTE — Telephone Encounter (Signed)
Spoke with patient who reports she did restart Metoprolol 25 mg daily and her BP has been better.  She is still awaiting an appt to f/u with her PCP regarding left upper back pain that is transient and fleeting.  She will continue to monitor BP and let us know if she starts having problems with it whether it's too high or too low.  She thanked me for the f/u phone call and reassurance.

## 2018-09-23 DIAGNOSIS — H43813 Vitreous degeneration, bilateral: Secondary | ICD-10-CM | POA: Diagnosis not present

## 2018-09-23 DIAGNOSIS — H524 Presbyopia: Secondary | ICD-10-CM | POA: Diagnosis not present

## 2018-09-23 DIAGNOSIS — H52203 Unspecified astigmatism, bilateral: Secondary | ICD-10-CM | POA: Diagnosis not present

## 2018-10-21 DIAGNOSIS — R079 Chest pain, unspecified: Secondary | ICD-10-CM | POA: Diagnosis not present

## 2018-10-21 DIAGNOSIS — M546 Pain in thoracic spine: Secondary | ICD-10-CM | POA: Diagnosis not present

## 2018-10-21 DIAGNOSIS — R071 Chest pain on breathing: Secondary | ICD-10-CM | POA: Diagnosis not present

## 2018-10-21 DIAGNOSIS — R0789 Other chest pain: Secondary | ICD-10-CM | POA: Diagnosis not present

## 2018-10-27 DIAGNOSIS — L718 Other rosacea: Secondary | ICD-10-CM | POA: Diagnosis not present

## 2018-10-27 DIAGNOSIS — L570 Actinic keratosis: Secondary | ICD-10-CM | POA: Diagnosis not present

## 2018-11-16 ENCOUNTER — Other Ambulatory Visit: Payer: Self-pay | Admitting: Physician Assistant

## 2018-11-16 DIAGNOSIS — L258 Unspecified contact dermatitis due to other agents: Secondary | ICD-10-CM | POA: Diagnosis not present

## 2019-01-21 ENCOUNTER — Telehealth: Payer: Self-pay

## 2019-01-21 NOTE — Telephone Encounter (Signed)
YOUR CARDIOLOGY TEAM HAS ARRANGED FOR AN E-VISIT FOR YOUR APPOINTMENT - PLEASE REVIEW IMPORTANT INFORMATION BELOW SEVERAL DAYS PRIOR TO YOUR APPOINTMENT  Due to the recent COVID-19 pandemic, we are transitioning in-person office visits to tele-medicine visits in an effort to decrease unnecessary exposure to our patients, their families, and staff. These visits are billed to your insurance just like a normal visit is. We also encourage you to sign up for MyChart if you have not already done so. You will need a smartphone if possible. For patients that do not have this, we can still complete the visit using a regular telephone but do prefer a smartphone to enable video when possible. You may have a family member that lives with you that can help. If possible, we also ask that you have a blood pressure cuff and scale at home to measure your blood pressure, heart rate and weight prior to your scheduled appointment. Patients with clinical needs that need an in-person evaluation and testing will still be able to come to the office if absolutely necessary. If you have any questions, feel free to call our office.     YOUR PROVIDER WILL BE USING THE FOLLOWING PLATFORM TO COMPLETE YOUR VISIT: [email or web address]  . IF USING MYCHART - How to Download the MyChart App to Your SmartPhone   - If Apple, go to App Store and type in MyChart in the search bar and download the app. If Android, ask patient to go to Google Play Store and type in MyChart in the search bar and download the app. The app is free but as with any other app downloads, your phone may require you to verify saved payment information or Apple/Android password.  - You will need to then log into the app with your MyChart username and password, and select [facility] as your healthcare provider to link the account.  - When it is time for your visit, go to the MyChart app, find appointments, and click Begin Video Visit. Be sure to Select Allow for your device to  access the Microphone and Camera for your visit. You will then be connected, and your provider will be with you shortly.  **If you have any issues connecting or need assistance, please contact MyChart service desk [phone] ([phone])**  **If using a computer, in order to ensure the best quality for your visit, you will need to use either of the following Internet Browsers: Google Chrome or Microsoft Edge**  . IF USING DOXIMITY or [email or web address].ME - The staff will give you instructions on receiving your link to join the meeting the day of your visit.      2-3 DAYS BEFORE YOUR APPOINTMENT  You will receive a telephone call from one of our [facility] team members - your caller ID may say "Unknown caller." If this is a video visit, we will walk you through how to get the video launched on your phone. We will remind you check your blood pressure, heart rate and weight prior to your scheduled appointment. If you have an Apple Watch or Kardia, please upload any pertinent ECG strips the day before or morning of your appointment to MyChart. Our staff will also make sure you have reviewed the consent and agree to move forward with your scheduled tele-health visit.     THE DAY OF YOUR APPOINTMENT  Approximately 15 minutes prior to your scheduled appointment, you will receive a telephone call from one of HeartCare team - your caller ID may say "Unknown caller."    Our staff will confirm medications, vital signs for the day and any symptoms you may be experiencing. Please have this information available prior to the time of visit start. It may also be helpful for you to have a pad of paper and pen handy for any instructions given during your visit. They will also walk you through joining the smartphone meeting if this is a video visit.    CONSENT FOR TELE-HEALTH VISIT - PLEASE REVIEW  I hereby voluntarily request, consent and authorize [facility] and its employed or contracted physicians, physician  assistants, nurse practitioners or other licensed health care professionals (the Practitioner), to provide me with telemedicine health care services (the "Services") as deemed necessary by the treating Practitioner. I acknowledge and consent to receive the Services by the Practitioner via telemedicine. I understand that the telemedicine visit will involve communicating with the Practitioner through live audiovisual communication technology and the disclosure of certain medical information by electronic transmission. I acknowledge that I have been given the opportunity to request an in-person assessment or other available alternative prior to the telemedicine visit and am voluntarily participating in the telemedicine visit.  I understand that I have the right to withhold or withdraw my consent to the use of telemedicine in the course of my care at any time, without affecting my right to future care or treatment, and that the Practitioner or I may terminate the telemedicine visit at any time. I understand that I have the right to inspect all information obtained and/or recorded in the course of the telemedicine visit and may receive copies of available information for a reasonable fee.  I understand that some of the potential risks of receiving the Services via telemedicine include:  . Delay or interruption in medical evaluation due to technological equipment failure or disruption; . Information transmitted may not be sufficient (e.g. poor resolution of images) to allow for appropriate medical decision making by the Practitioner; and/or  . In rare instances, security protocols could fail, causing a breach of personal health information.  Furthermore, I acknowledge that it is my responsibility to provide information about my medical history, conditions and care that is complete and accurate to the best of my ability. I acknowledge that Practitioner's advice, recommendations, and/or decision may be based on  factors not within their control, such as incomplete or inaccurate data provided by me or distortions of diagnostic images or specimens that may result from electronic transmissions. I understand that the practice of medicine is not an exact science and that Practitioner makes no warranties or guarantees regarding treatment outcomes. I acknowledge that I will receive a copy of this consent concurrently upon execution via email to the email address I last provided but may also request a printed copy by calling the office of [facility].    I understand that my insurance will be billed for this visit.   I have read or had this consent read to me. . I understand the contents of this consent, which adequately explains the benefits and risks of the Services being provided via telemedicine.  . I have been provided ample opportunity to ask questions regarding this consent and the Services and have had my questions answered to my satisfaction. . I give my informed consent for the services to be provided through the use of telemedicine in my medical care  By participating in this telemedicine visit I agree to the above.  

## 2019-01-22 ENCOUNTER — Telehealth (INDEPENDENT_AMBULATORY_CARE_PROVIDER_SITE_OTHER): Payer: BLUE CROSS/BLUE SHIELD | Admitting: Cardiology

## 2019-01-22 ENCOUNTER — Encounter: Payer: Self-pay | Admitting: Cardiology

## 2019-01-22 ENCOUNTER — Other Ambulatory Visit: Payer: Self-pay

## 2019-01-22 VITALS — BP 120/79 | Ht 61.5 in | Wt 135.0 lb

## 2019-01-22 DIAGNOSIS — Z8249 Family history of ischemic heart disease and other diseases of the circulatory system: Secondary | ICD-10-CM

## 2019-01-22 DIAGNOSIS — I700 Atherosclerosis of aorta: Secondary | ICD-10-CM

## 2019-01-22 DIAGNOSIS — E7800 Pure hypercholesterolemia, unspecified: Secondary | ICD-10-CM

## 2019-01-22 DIAGNOSIS — I10 Essential (primary) hypertension: Secondary | ICD-10-CM

## 2019-01-22 NOTE — Patient Instructions (Signed)
Medication Instructions:  Your physician recommends that you continue on your current medications as directed. Please refer to the Current Medication list given to you today.   If you need a refill on your cardiac medications before your next appointment, please call your pharmacy.   Lab work: None If you have labs (blood work) drawn today and your tests are completely normal, you will receive your results only by: . MyChart Message (if you have MyChart) OR . A paper copy in the mail If you have any lab test that is abnormal or we need to change your treatment, we will call you to review the results.  Testing/Procedures: None  Follow-Up: At [facility], you and your health needs are our priority.  As part of our continuing mission to provide you with exceptional heart care, we have created designated Provider Care Teams.  These Care Teams include your primary Cardiologist (physician) and Advanced Practice Providers (APPs -  Physician Assistants and Nurse Practitioners) who all work together to provide you with the care you need, when you need it. You will need a follow up appointment in 12 months.  Please call our office 2 months in advance to schedule this appointment.  You may see Mark Skains, MD or one of the following Advanced Practice Providers on your designated Care Team:   Lori Gerhardt, NP Laura Ingold, NP . Jill McDaniel, NP  Any Other Special Instructions Will Be Listed Below (If Applicable).    

## 2019-01-22 NOTE — Progress Notes (Signed)
Virtual Visit via Video Note   This visit type was conducted due to national recommendations for restrictions regarding the COVID-19 Pandemic (e.g. social distancing) in an effort to limit this patient's exposure and mitigate transmission in our community.  Due to her co-morbid illnesses, this patient is at least at moderate risk for complications without adequate follow up.  This format is felt to be most appropriate for this patient at this time.  All issues noted in this document were discussed and addressed.  A limited physical exam was performed with this format.  Please refer to the patient's chart for her consent to telehealth for [facility].   Date:  01/22/2019   ID:  [name], DOB 1956-06-23, MRN [id number]  Patient Location: Home Provider Location: Home  PCP:  Pearson Grippe, MD  Cardiologist:  Donato Schultz, MD  Electrophysiologist:  None   Evaluation Performed:  Follow-Up Visit  Chief Complaint: Very mild aortic atherosclerosis follow-up  History of Present Illness:    [name] is a 63 y.o. female with hypertension family history of coronary artery disease here for follow-up.  Former patient of Dr. Yevonne Pax.  Since she was taking Toprol previously she began to have temperature intolerance to heat, post oophorectomy many years ago, TSH was normal, mother had a large myocardial infarction, hospice.  Quite anxious and under stress during prior visit with Norma Fredrickson.  Her husband had pancreatic cancer.  She is a Interior and spatial designer.  Currently denies any fevers chills nausea vomiting syncope bleeding.  Overall she is quite pleased about how she is doing.  Since she has been out of work, decrease stress.  Been feeling good.  Walking may be twice a day.  Her blood pressure has been excellent.  The patient does not have symptoms concerning for COVID-19 infection (fever, chills, cough, or new shortness of breath).    Past Medical History:  Diagnosis Date  .  Anxiety   . Depression   . History of CT scan    Coronary calcium CT 3/19: Calcium score 0  . Hyperlipidemia   . Polyp of colon 2009   benign  . PVC's (premature ventricular contractions)    a. prior event monitor 02/2014: NSR, rare PVC.  Marland Kitchen SAB (spontaneous abortion)   . Stress    situational  . Trisomy 18    prior pregnancy with Trisomy 18   Past Surgical History:  Procedure Laterality Date  . CARPAL TUNNEL RELEASE Right 2004  . CESAREAN SECTION     x 2 ( 1 with trisomy 18)  . MYOMECTOMY    . TOTAL ABDOMINAL HYSTERECTOMY W/ BILATERAL SALPINGOOPHORECTOMY  03/04/2006     Current Meds  Medication Sig  . aspirin 81 MG tablet Take 81 mg by mouth daily.  . Biotin 5000 MCG TABS TAKE 2 TABLETS BY MOUTH DAILY  . Cyanocobalamin (VITAMIN B 12 PO) Take 1,000 mcg by mouth daily.   . folic acid (FOLVITE) 1 MG tablet Take 400 mcg by mouth 2 (two) times a day.   Marland Kitchen MAGNESIUM CARBONATE PO Take 500 mg by mouth daily.   . metoprolol succinate (TOPROL-XL) 25 MG 24 hr tablet Take 12.5 mg by mouth daily.  . Multiple Vitamin (MULTIVITAMIN) tablet Take 1 tablet by mouth daily. Gummy  . VITAMIN D, CHOLECALCIFEROL, PO Take 1 capsule by mouth daily.     Allergies:   Amoxicillin; Codeine; Megace [megestrol]; and Morphine and related   Social History   Tobacco Use  . Smoking status:  Never Smoker  . Smokeless tobacco: Never Used  Substance Use Topics  . Alcohol use: No  . Drug use: No     Family Hx: The patient's family history includes Atrial fibrillation in her father; Heart attack (age of onset: 39) in her mother; Heart failure in her father; Hypertension in her father and mother; Lung cancer in her father. There is no history of Stroke.  ROS:   Please see the history of present illness.    No fevers chills nausea vomiting syncope bleeding All other systems reviewed and are negative.   Prior CV studies:   The following studies were reviewed today:  Cardiac event monitor 02/08/2014-sinus  rhythm no adverse arrhythmias  Labs/Other Tests and Data Reviewed:    EKG:  An ECG dated 11/25/17 was personally reviewed today and demonstrated:  Sinus rhythm nonspecific T wave changes  Recent Labs: No results found for requested labs within last 8760 hours.   Recent Lipid Panel Lab Results  Component Value Date/Time   CHOL 226 (H) 07/09/2017 10:27 AM   TRIG 91 07/09/2017 10:27 AM   HDL 67 07/09/2017 10:27 AM   CHOLHDL 3.4 07/09/2017 10:27 AM   CHOLHDL 4 06/21/2013 09:24 AM   LDLCALC 141 (H) 07/09/2017 10:27 AM   LDLDIRECT 160.4 06/21/2013 09:24 AM    Wt Readings from Last 3 Encounters:  01/22/19 135 lb (61.2 kg)  06/12/18 133 lb 12.8 oz (60.7 kg)  11/25/17 128 lb 1.9 oz (58.1 kg)     Objective:    Vital Signs:  BP 120/79   Ht 5' 1.5" (1.562 m)   Wt 135 lb (61.2 kg)   BMI 25.09 kg/m    VITAL SIGNS:  reviewed GEN:  no acute distress EYES:  sclerae anicteric, EOMI - Extraocular Movements Intact RESPIRATORY:  normal respiratory effort, symmetric expansion SKIN:  no rash, lesions or ulcers. MUSCULOSKELETAL:  no obvious deformities. NEURO:  alert and oriented x 3, no obvious focal deficit PSYCH:  normal affect  ASSESSMENT & PLAN:    Very mild aortic atherosclerosis - Personally reviewed CT scan with her at prior office visit.  Very small speck of calcium in the descending aorta.  At that time, she opted to continue with diet and exercise and blood pressure control.  This seems reasonable.  Aggressive therapy would be statin.  Not interested at that time.  Coronary calcium score was 0.  Family history of coronary artery disease -Mother died of heart attack.  I had previously seen her in the emergency room.  Also lost her husband to pancreatic cancer.  Previous year was stressful for her.  She seems to be doing better now.  Essential hypertension - Blood pressure has been excellent.  Continue with current regimen.  As above.  Hyperlipidemia -Prior LDL in the 140  range.  Coronary calcium was 0.  COVID-19 Education: The signs and symptoms of COVID-19 were discussed with the patient and how to seek care for testing (follow up with PCP or arrange E-visit).  The importance of social distancing was discussed today.  Time:   Today, I have spent 11 minutes with the patient with telehealth technology discussing the above problems.     Medication Adjustments/Labs and Tests Ordered: Current medicines are reviewed at length with the patient today.  Concerns regarding medicines are outlined above.   Tests Ordered: No orders of the defined types were placed in this encounter.   Medication Changes: No orders of the defined types were placed in this encounter.  Disposition:  Follow up in 1 year(s)  Signed, Donato Schultz, MD  01/22/2019 10:00 AM    [facility]

## 2019-02-12 ENCOUNTER — Other Ambulatory Visit: Payer: Self-pay | Admitting: Physician Assistant

## 2019-04-14 DIAGNOSIS — D485 Neoplasm of uncertain behavior of skin: Secondary | ICD-10-CM | POA: Diagnosis not present

## 2019-04-14 DIAGNOSIS — L309 Dermatitis, unspecified: Secondary | ICD-10-CM | POA: Diagnosis not present

## 2019-04-22 ENCOUNTER — Other Ambulatory Visit: Payer: Self-pay | Admitting: Internal Medicine

## 2019-04-22 DIAGNOSIS — Z1231 Encounter for screening mammogram for malignant neoplasm of breast: Secondary | ICD-10-CM

## 2019-04-24 DIAGNOSIS — R0982 Postnasal drip: Secondary | ICD-10-CM | POA: Diagnosis not present

## 2019-04-24 DIAGNOSIS — R509 Fever, unspecified: Secondary | ICD-10-CM | POA: Diagnosis not present

## 2019-04-24 DIAGNOSIS — U071 COVID-19: Secondary | ICD-10-CM | POA: Diagnosis not present

## 2019-04-24 DIAGNOSIS — R05 Cough: Secondary | ICD-10-CM | POA: Diagnosis not present

## 2019-06-09 ENCOUNTER — Ambulatory Visit
Admission: RE | Admit: 2019-06-09 | Discharge: 2019-06-09 | Disposition: A | Discharge status: Home / Self Care | Payer: BC Managed Care – PPO | Source: Ambulatory Visit | Attending: Internal Medicine | Admitting: Internal Medicine

## 2019-06-09 ENCOUNTER — Other Ambulatory Visit: Payer: Self-pay

## 2019-06-09 DIAGNOSIS — Z1231 Encounter for screening mammogram for malignant neoplasm of breast: Secondary | ICD-10-CM

## 2019-06-29 DIAGNOSIS — U071 COVID-19: Secondary | ICD-10-CM | POA: Diagnosis not present

## 2019-06-29 DIAGNOSIS — R5383 Other fatigue: Secondary | ICD-10-CM | POA: Diagnosis not present

## 2019-06-29 DIAGNOSIS — Z0000 Encounter for general adult medical examination without abnormal findings: Secondary | ICD-10-CM | POA: Diagnosis not present

## 2019-06-30 DIAGNOSIS — U071 COVID-19: Secondary | ICD-10-CM | POA: Diagnosis not present

## 2019-07-05 DIAGNOSIS — I10 Essential (primary) hypertension: Secondary | ICD-10-CM | POA: Diagnosis not present

## 2019-07-05 DIAGNOSIS — U071 COVID-19: Secondary | ICD-10-CM | POA: Diagnosis not present

## 2019-08-05 IMAGING — CR DG CHEST 2V
2 series · 2 of 2 positions shown · non-contrast
Comparison: 03/03/2006

CLINICAL DATA: Shortness of Breath

EXAM:
CHEST  2 VIEW

[w chest pa]
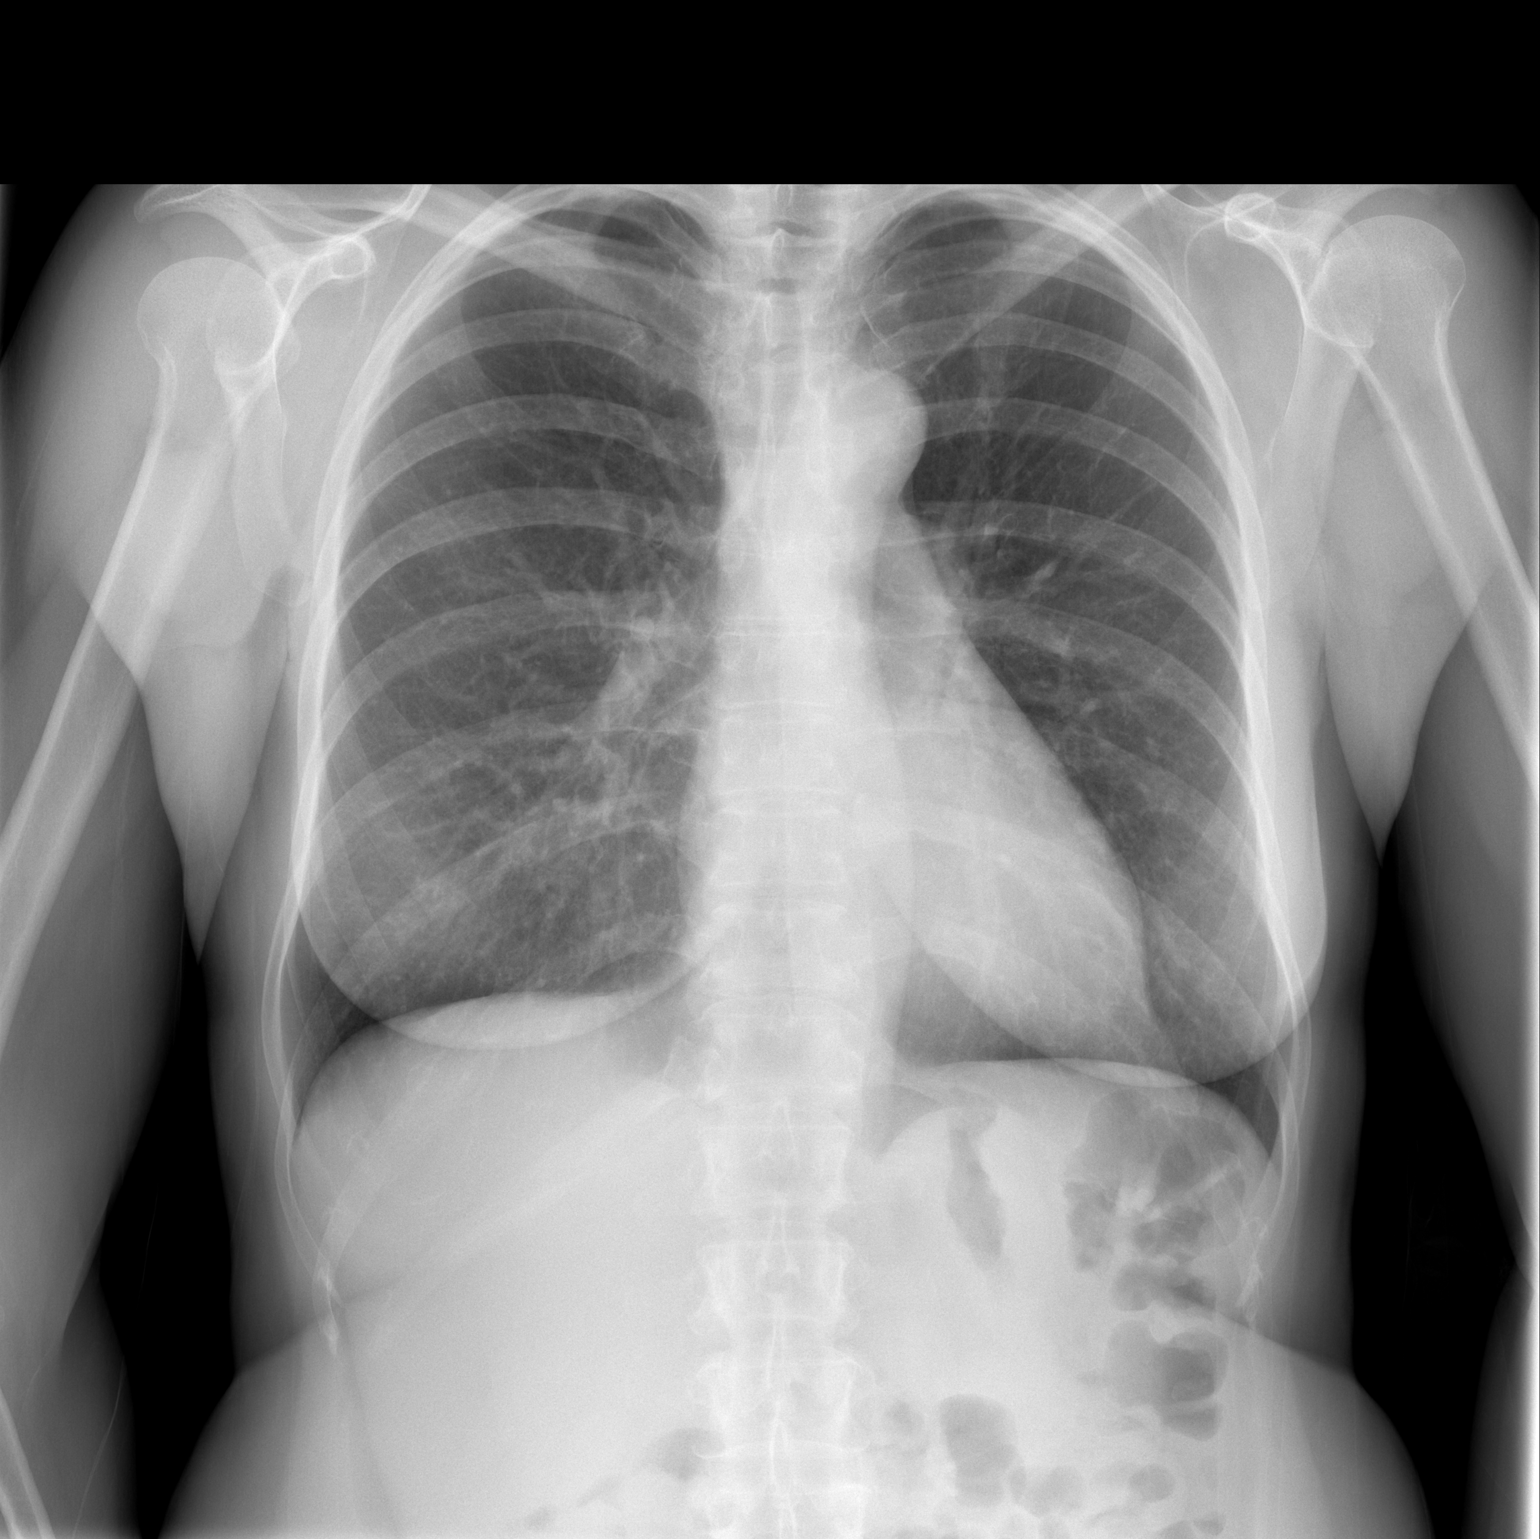

[w chest lat]
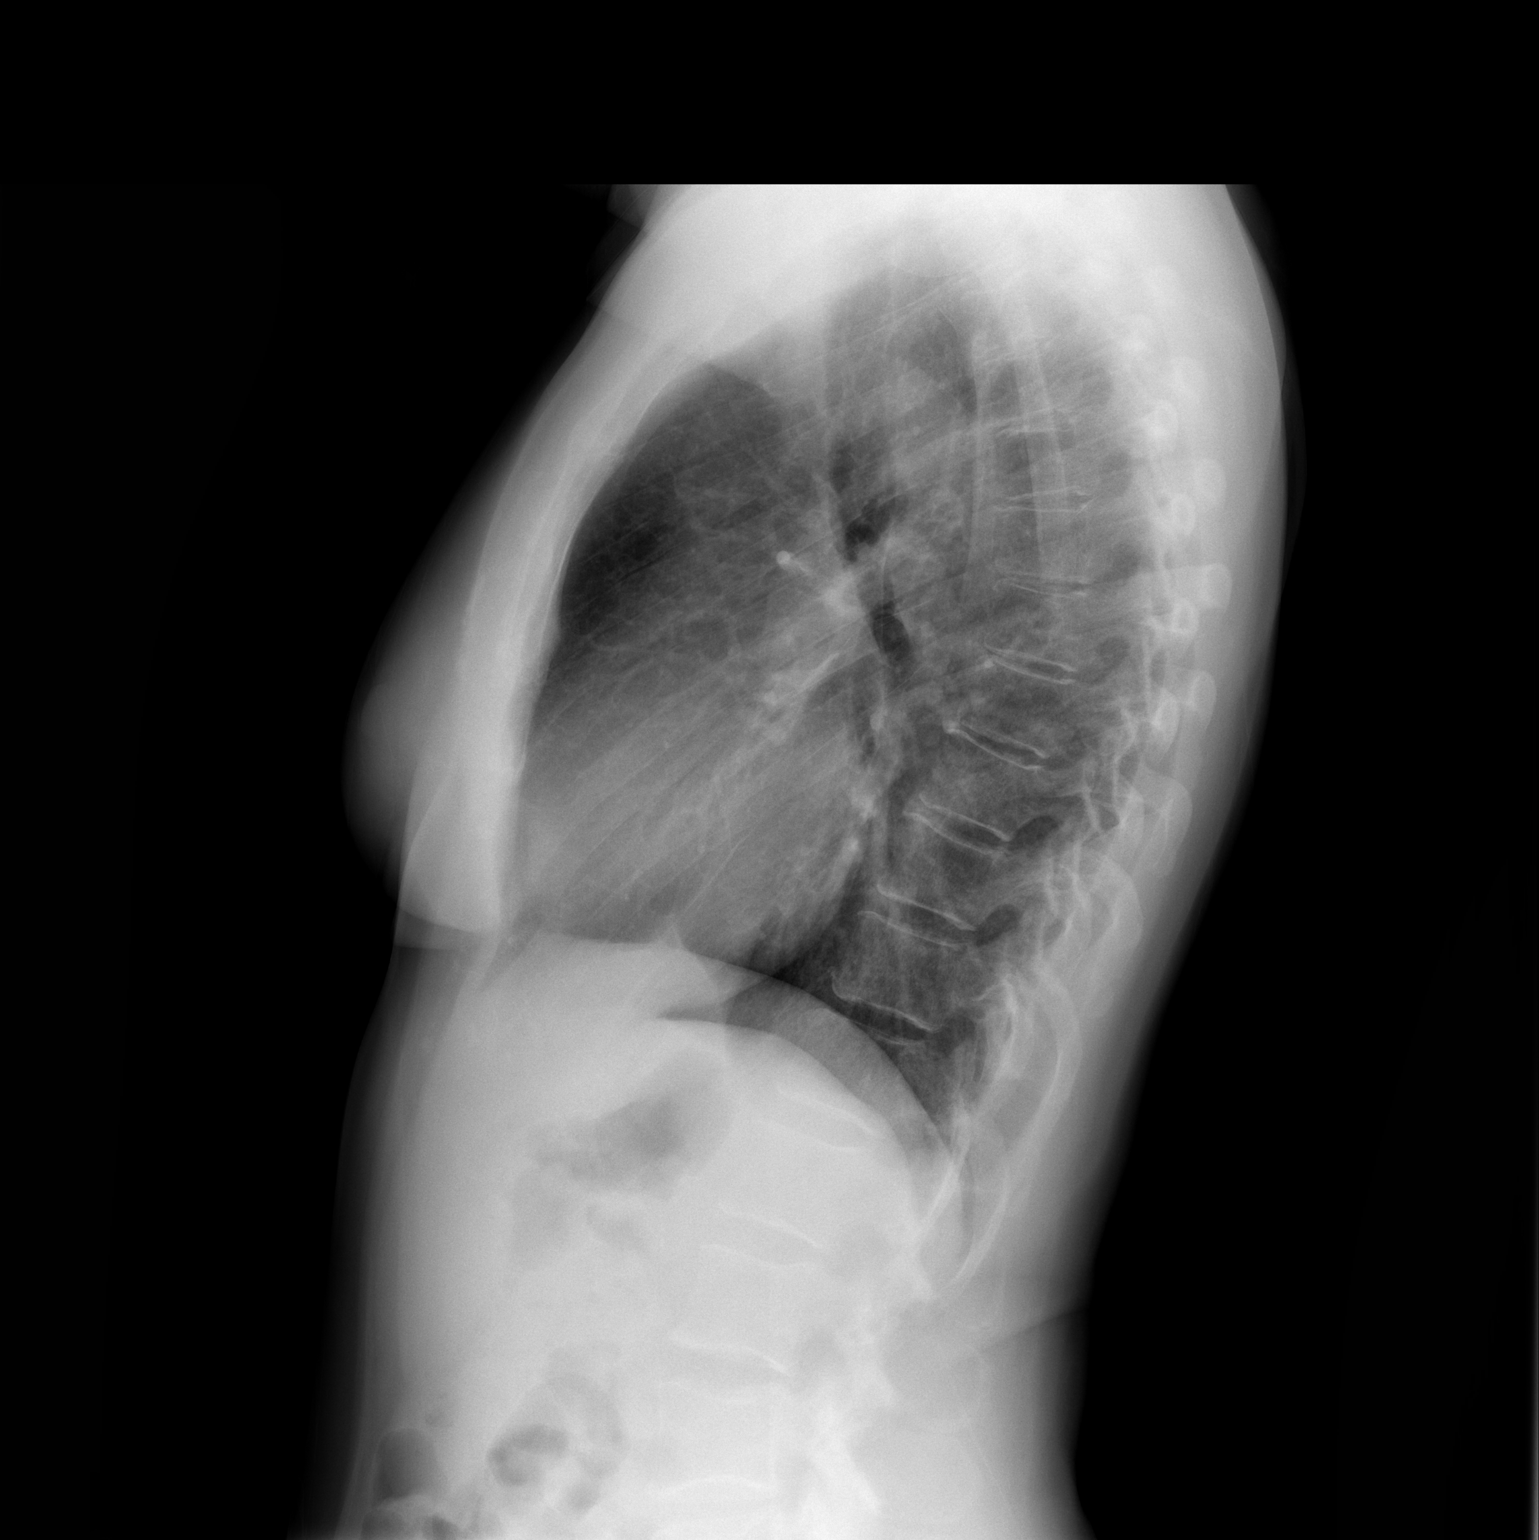

[2 of 2 positions shown; findings below may reference images not displayed]

FINDINGS: The heart size and mediastinal contours are within normal limits.
Both lungs are clear. The visualized skeletal structures are
unremarkable.
IMPRESSION: No active cardiopulmonary disease.

## 2019-10-12 DIAGNOSIS — H52203 Unspecified astigmatism, bilateral: Secondary | ICD-10-CM | POA: Diagnosis not present

## 2019-10-12 DIAGNOSIS — H25013 Cortical age-related cataract, bilateral: Secondary | ICD-10-CM | POA: Diagnosis not present

## 2019-12-06 ENCOUNTER — Telehealth: Payer: Self-pay | Admitting: Cardiology

## 2019-12-06 NOTE — Telephone Encounter (Signed)
Returned call to patient who states she is having worsening palpitations since Thursday. She states the palpitations are lasting longer than ever in the past and have continued to occur through the weekend and today. She states her heart rate feels irregular, she gets SOB walking a short distance, has a headache, and has discomfort that radiates up the back of her neck. She denies increased caffeine intake, reports she does not drink alcohol, and drinks only water all day. She states she has lost 10 lb over the past year and is walking frequently for exercise. States symptoms are not interfering with sleep. I scheduled her to see Jacolyn Reedy, PA tomorrow, 3/30. Patient was grateful for the return call and the quick appointment.

## 2019-12-06 NOTE — Telephone Encounter (Signed)
Patient c/o Palpitations:  High priority if patient c/o lightheadedness, shortness of breath, or chest pain  1) How long have you had palpitations/irregular HR/ Afib? Are you having the symptoms now? yes  2) Are you currently experiencing lightheadedness, SOB or CP? no  3) Do you have a history of afib (atrial fibrillation) or irregular heart rhythm? Yes irregular heart rhythm  4) Have you checked your BP or HR? (document readings if available): sitting is BP is normal, when going up stairs BP was 101/85 HR 80, but felt like it was pounding  5) Are you experiencing any other symptoms? SOB on exertion  Patient states she has been feeling her heart fluttering and would like to know if she should be seen today. She states her HR was only in 80's, but felt like it was pounding. She also states it has been making her SOB and she has a headache. She also states she had some back pain at the bottom of her shoulder blades. She states she had covid in August and is unsure if this is related. Please advise.

## 2019-12-06 NOTE — Progress Notes (Signed)
Cardiology Office Note    Date:  12/07/2019   ID:  Mervyn SkeetersBrenda Holland Fell, DOB 31-Jul-1956, MRN [id number]  PCP:  [name], James, MD  Cardiologist: Linda SchultzMark Skains, MD EPS: None  No chief complaint on file.   History of Present Illness:  [name] is a 64 y.o. female with history of HTN, family history of early CAD, HLD, CT cardiac calcium score 0 12/02/17 and very small speck calcium in descending aorta.   Patient added onto my schedule because of increase in palpitations. Last Thurs am when getting ready for work she felt some fluttering, fast, felt weak, lasting 1 hr, and then was off/on all through the weekend. 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 Walking from her car to the house yesterday her heart started racing-eased within a minute with rest. Drinks 1 cup coffee in am, no alcohol. Has cut back on caffeine to 1/2 cup/day. No OTC meds and. has a slight headache. Has lost her husband, father and mother in the past 10 yrs.doesn't feel like she's under any extra stress. Walking a mile twice a day. Occurred when she was walking. Had Covid19 last August, hasn't been vaccinated yet. Father had Afib  Past Medical History:  Diagnosis Date  . Anxiety   . Depression   . History of CT scan    Coronary calcium CT 3/19: Calcium score 0  . Hyperlipidemia   . Polyp of colon 2009   benign  . PVC's (premature ventricular contractions)    a. prior event monitor 02/2014: NSR, rare PVC.  Marland Kitchen. SAB (spontaneous abortion)   . Stress    situational  . Trisomy 18    prior pregnancy with Trisomy 18    Past Surgical History:  Procedure Laterality Date  . CARPAL TUNNEL RELEASE Right 2004  . CESAREAN SECTION     x 2 ( 1 with trisomy 18)  . MYOMECTOMY    . TOTAL ABDOMINAL HYSTERECTOMY W/ BILATERAL SALPINGOOPHORECTOMY  03/04/2006    Current Medications: Current Meds  Medication Sig  . aspirin 81 MG tablet Take 81 mg by mouth daily.  . Biotin  5000 MCG TABS TAKE 2 TABLETS BY MOUTH DAILY  . Cyanocobalamin (VITAMIN B 12 PO) Take 1,000 mcg by mouth daily.   . folic acid (FOLVITE) 1 MG tablet Take 400 mcg by mouth 2 (two) times a day.   Marland Kitchen. MAGNESIUM CARBONATE PO Take 500 mg by mouth daily.   . metoprolol succinate (TOPROL-XL) 25 MG 24 hr tablet Take 12.5 mg by mouth daily.  . Multiple Vitamin (MULTIVITAMIN) tablet Take 1 tablet by mouth daily. Gummy  . VITAMIN D, CHOLECALCIFEROL, PO Take 1 capsule by mouth daily.     Allergies:   Amoxicillin, Codeine, Megace [megestrol], and Morphine and related   Social History   Socioeconomic History  . Marital status: Widowed    Spouse name: Not on file  . Number of children: Not on file  . Years of education: Not on file  . Highest education level: Not on file  Occupational History  . Not on file  Tobacco Use  . Smoking status: Never Smoker  . Smokeless tobacco: Never Used  Substance and Sexual Activity  . Alcohol use: No  . Drug use: No  . Sexual activity: Never  Other Topics Concern  . Not on file  Social History Narrative  . Not on file   Social Determinants of Health  Financial Resource Strain:   . Difficulty of Paying Living Expenses:   Food Insecurity:   . Worried About Programme researcher, broadcasting/film/video in the Last Year:   . Barista in the Last Year:   Transportation Needs:   . Freight forwarder (Medical):   Marland Kitchen Lack of Transportation (Non-Medical):   Physical Activity:   . Days of Exercise per Week:   . Minutes of Exercise per Session:   Stress:   . Feeling of Stress :   Social Connections:   . Frequency of Communication with Friends and Family:   . Frequency of Social Gatherings with Friends and Family:   . Attends Religious Services:   . Active Member of Clubs or Organizations:   . Attends Banker Meetings:   Marland Kitchen Marital Status:      Family History:  The patient's   family history includes Atrial fibrillation in her father; Heart attack (age of  onset: 14) in her mother; Heart failure in her father; Hypertension in her father and mother; Lung cancer in her father.   ROS:   Please see the history of present illness.    ROS All other systems reviewed and are negative.   PHYSICAL EXAM:   VS:  BP 122/78   Pulse 76   Ht 5' 1.5" (1.562 m)   Wt 131 lb 12.8 oz (59.8 kg)   SpO2 99%   BMI 24.50 kg/m   Physical Exam  GEN: Well nourished, well developed, in no acute distress  Neck: no JVD, carotid bruits, or masses Cardiac:RRR; no murmurs, rubs, or gallops  Respiratory:  clear to auscultation bilaterally, normal work of breathing GI: soft, nontender, nondistended, + BS Ext: without cyanosis, clubbing, or edema, Good distal pulses bilaterally Neuro:  Alert and Oriented x 3 Psych: euthymic mood, full affect  Wt Readings from Last 3 Encounters:  12/07/19 131 lb 12.8 oz (59.8 kg)  01/22/19 135 lb (61.2 kg)  06/12/18 133 lb 12.8 oz (60.7 kg)      Studies/Labs Reviewed:   EKG:  EKG is ordered today.  The ekg ordered today demonstrates NSR with nonspecific ST changes, no change from prior tracings.  Recent Labs: No results found for requested labs within last 8760 hours.   Lipid Panel    Component Value Date/Time   CHOL 226 (H) 07/09/2017 1027   TRIG 91 07/09/2017 1027   HDL 67 07/09/2017 1027   CHOLHDL 3.4 07/09/2017 1027   CHOLHDL 4 06/21/2013 0924   VLDL 26.0 06/21/2013 0924   LDLCALC 141 (H) 07/09/2017 1027   LDLDIRECT 160.4 06/21/2013 0924    Additional studies/ records that were reviewed today include:  CT cardiac scoring 11/2017 : The patient was scanned on a Siemens Somatom 64 slice scanner. Axial non-contrast 3 mm slices were carried out through the heart. The data set was analyzed on a dedicated work station and scored using the Agatson method.   FINDINGS: Non-cardiac: See separate report from [facility].   Ascending Aorta: Normal diameter 3.3 cm   Pericardium: Normal   Coronary arteries: No  calcium noted   IMPRESSION: Coronary calcium score of 0.   [name]     Electronically Signed   By: [name] M.D.   On: 12/03/2017 08:55    Addended by Wendall Stade, MD on 12/03/2017  8:57 AM  Study Result  EXAM: OVER-READ INTERPRETATION  CT CHEST   The following report is an over-read performed by radiologist Dr. Caryn Bee  [facility], [place] on 12/03/2017. This over-read does not include interpretation of cardiac or coronary anatomy or pathology. The coronary calcium score interpretation by the cardiologist is attached.   COMPARISON:  None.   FINDINGS: Vascular: Heart is normal size. Visualized aorta is normal caliber. Scattered calcifications in the visualized descending thoracic aorta   Mediastinum/Nodes: No adenopathy in the lower mediastinum or hila.   Lungs/Pleura: Visualized lungs clear.  No effusions.   Upper Abdomen: Imaging into the upper abdomen shows no acute findings.   Musculoskeletal: Chest wall soft tissues are unremarkable. No acute bony abnormality.   IMPRESSION: Mild aortic atherosclerosis in the descending thoracic aorta. No visible aneurysm.   Electronically Signed: By: Charlett Nose M.D. On: 12/03/2017 08:32       ASSESSMENT:    1. Palpitations   2. Shortness of breath   3. Essential hypertension   4. Hyperlipidemia, unspecified hyperlipidemia type   5. Family history of early CAD   6. Educated about COVID-19 virus infection      PLAN:  In order of problems listed above:  Palpitations worse in the past 5 days at rest and with exertion. No change in caffeine, OTC meds, stress. Had covid19 in August with shortness of breath. Will check labs including TSH, CBC, CMET, 2D echo, f/u with myself or Dr. Anne Fu after results back  HTN controlled on low dose toprol  HLD -hasn't been taking red yeast rice and asked her to restart.followed by PCP  Family history of early CAD  Covid19 in August-told her she should  get vaccinated.    Medication Adjustments/Labs and Tests Ordered: Current medicines are reviewed at length with the patient today.  Concerns regarding medicines are outlined above.  Medication changes, Labs and Tests ordered today are listed in the Patient Instructions below. Patient Instructions  Medication Instructions:  Your physician recommends that you continue on your current medications as directed. Please refer to the Current Medication list given to you today.  *If you need a refill on your cardiac medications before your next appointment, please call your pharmacy*   Lab Work: Today: CMET, Magnesium, CBC, TSH  If you have labs (blood work) drawn today and your tests are completely normal, you will receive your results only by: Marland Kitchen MyChart Message (if you have MyChart) OR . A paper copy in the mail If you have any lab test that is abnormal or we need to change your treatment, we will call you to review the results.   Testing/Procedures: Your physician has requested that you have an echocardiogram. Echocardiography is a painless test that uses sound waves to create images of your heart. It provides your doctor with information about the size and shape of your heart and how well your heart's chambers and valves are working. This procedure takes approximately one hour. There are no restrictions for this procedure.  Your physician has recommended that you wear a 14 day monitor. These monitors are medical devices that record the heart's electrical activity. Doctors most often use these monitors to diagnose arrhythmias. Arrhythmias are problems with the speed or rhythm of the heartbeat. The monitor is a small, portable device. You can wear one while you do your normal daily activities. This is usually used to diagnose what is causing palpitations/syncope (passing out).     Follow-Up: At [facility], you and your health needs are our priority.  As part of our continuing mission to  provide you with exceptional heart care, we have created designated  Provider Care Teams.  These Care Teams include your primary Cardiologist (physician) and Advanced Practice Providers (APPs -  Physician Assistants and Nurse Practitioners) who all work together to provide you with the care you need, when you need it.  We recommend signing up for the patient portal called "MyChart".  Sign up information is provided on this After Visit Summary.  MyChart is used to connect with patients for Virtual Visits (Telemedicine).  Patients are able to view lab/test results, encounter notes, upcoming appointments, etc.  Non-urgent messages can be sent to your provider as well.   To learn more about what you can do with MyChart, go to [email or web address].    Your next appointment:   6 week(s)  The format for your next appointment:   In Person  Provider:   Donato Schultz, MD or Jacolyn Reedy, PA   Other Instructions We are recommending the COVID-19 vaccine to all of our patients. Cardiac medications (including blood thinners) should not deter anyone from being vaccinated and there is no need to hold any of those medications prior to vaccine administration.   Currently, there is a hotline to call (active 09/17/19) to schedule vaccination appointments as no walk-ins will be accepted.    Vaccines through the health department can be arranged by calling [phone]    Vaccines through [facility] can be arranged by calling [phone] or visiting [email or web address]   If you have further questions or concerns about the vaccine process, please visit [email or web address], [email or web address], or contact your primary care physician.     ZIO XT- Long Term Monitor Instructions   Your physician has requested you wear your ZIO patch monitor 14 days.   This is a single patch monitor.  [employer] supplies one patch monitor per enrollment.  Additional stickers are not available.   Please  do not apply patch if you will be having a Nuclear Stress Test, Echocardiogram, Cardiac CT, MRI, or Chest Xray during the time frame you would be wearing the monitor. The patch cannot be worn during these tests.  You cannot remove and re-apply the ZIO XT patch monitor.   Your ZIO patch monitor will be sent USPS Priority mail from [facility] directly to your home address. The monitor may also be mailed to a PO BOX if home delivery is not available.   It may take 3-5 days to receive your monitor after you have been enrolled.   Once you have received you monitor, please review enclosed instructions.  Your monitor has already been registered assigning a specific monitor serial # to you.   Applying the monitor   Shave hair from upper left chest.   Hold abrader disc by orange tab.  Rub abrader in 40 strokes over left upper chest as indicated in your monitor instructions.   Clean area with 4 enclosed alcohol pads .  Use all pads to assure are is cleaned thoroughly.  Let dry.   Apply patch as indicated in monitor instructions.  Patch will be place under collarbone on left side of chest with arrow pointing upward.   Rub patch adhesive wings for 2 minutes.Remove white label marked "1".  Remove white label marked "2".  Rub patch adhesive wings for 2 additional minutes.   While looking in a mirror, press and release button in center of patch.  A small green light will flash 3-4 times .  This will be your only indicator the monitor has been turned on.     Do not shower for the first 24  hours.  You may shower after the first 24 hours.   Press button if you feel a symptom. You will hear a small click.  Record Date, Time and Symptom in the Patient Log Book.   When you are ready to remove patch, follow instructions on last 2 pages of Patient Log Book.  Stick patch monitor onto last page of Patient Log Book.   Place Patient Log Book in [place] box.  Use locking tab on box and tape box closed securely.   The [employer] has [employer] on it.  Please place in mailbox as soon as possible.  Your physician should have your test results approximately 7 days after the monitor has been mailed back to [facility].   Call [place] at [phone] if you have questions regarding your ZIO XT patch monitor.  Call them immediately if you see an orange light blinking on your monitor.   If your monitor falls off in less than 4 days contact our Monitor department at [phone].  If your monitor becomes loose or falls off after 4 days call [employer] at [phone] for suggestions on securing your monitor.      Sumner Boast, PA-C  12/07/2019 1:18 PM    [facility] [address], [place], [place]  [address] Phone: [phone]; Fax: [phone]

## 2019-12-07 ENCOUNTER — Ambulatory Visit (INDEPENDENT_AMBULATORY_CARE_PROVIDER_SITE_OTHER): Payer: BC Managed Care – PPO | Admitting: Physician Assistant

## 2019-12-07 ENCOUNTER — Encounter: Payer: Self-pay | Admitting: Physician Assistant

## 2019-12-07 ENCOUNTER — Other Ambulatory Visit: Payer: Self-pay

## 2019-12-07 ENCOUNTER — Encounter: Payer: Self-pay | Admitting: *Deleted

## 2019-12-07 VITALS — BP 122/78 | HR 76 | Ht 61.5 in | Wt 131.8 lb

## 2019-12-07 DIAGNOSIS — Z7189 Other specified counseling: Secondary | ICD-10-CM

## 2019-12-07 DIAGNOSIS — E785 Hyperlipidemia, unspecified: Secondary | ICD-10-CM | POA: Diagnosis not present

## 2019-12-07 DIAGNOSIS — R002 Palpitations: Secondary | ICD-10-CM | POA: Diagnosis not present

## 2019-12-07 DIAGNOSIS — I10 Essential (primary) hypertension: Secondary | ICD-10-CM | POA: Diagnosis not present

## 2019-12-07 DIAGNOSIS — R0602 Shortness of breath: Secondary | ICD-10-CM

## 2019-12-07 DIAGNOSIS — Z8249 Family history of ischemic heart disease and other diseases of the circulatory system: Secondary | ICD-10-CM | POA: Diagnosis not present

## 2019-12-07 NOTE — Progress Notes (Signed)
Patient ID: [name], female   DOB: 03/24/1956, 64 y.o.   MRN: [id number] Patient enrolled for [employer] to mail a 14 day ZIO XT long term holter monitor to her home.

## 2019-12-07 NOTE — Patient Instructions (Addendum)
Medication Instructions:  Your physician recommends that you continue on your current medications as directed. Please refer to the Current Medication list given to you today.  *If you need a refill on your cardiac medications before your next appointment, please call your pharmacy*   Lab Work: Today: CMET, Magnesium, CBC, TSH  If you have labs (blood work) drawn today and your tests are completely normal, you will receive your results only by: Marland Kitchen MyChart Message (if you have MyChart) OR . A paper copy in the mail If you have any lab test that is abnormal or we need to change your treatment, we will call you to review the results.   Testing/Procedures: Your physician has requested that you have an echocardiogram. Echocardiography is a painless test that uses sound waves to create images of your heart. It provides your doctor with information about the size and shape of your heart and how well your heart's chambers and valves are working. This procedure takes approximately one hour. There are no restrictions for this procedure.  Your physician has recommended that you wear a 14 day monitor. These monitors are medical devices that record the heart's electrical activity. Doctors most often use these monitors to diagnose arrhythmias. Arrhythmias are problems with the speed or rhythm of the heartbeat. The monitor is a small, portable device. You can wear one while you do your normal daily activities. This is usually used to diagnose what is causing palpitations/syncope (passing out).     Follow-Up: At [facility], you and your health needs are our priority.  As part of our continuing mission to provide you with exceptional heart care, we have created designated Provider Care Teams.  These Care Teams include your primary Cardiologist (physician) and Advanced Practice Providers (APPs -  Physician Assistants and Nurse Practitioners) who all work together to provide you with the care you need, when  you need it.  We recommend signing up for the patient portal called "MyChart".  Sign up information is provided on this After Visit Summary.  MyChart is used to connect with patients for Virtual Visits (Telemedicine).  Patients are able to view lab/test results, encounter notes, upcoming appointments, etc.  Non-urgent messages can be sent to your provider as well.   To learn more about what you can do with MyChart, go to [email or web address].    Your next appointment:   6 week(s)  The format for your next appointment:   In Person  Provider:   Donato Schultz, MD or Jacolyn Reedy, PA   Other Instructions We are recommending the COVID-19 vaccine to all of our patients. Cardiac medications (including blood thinners) should not deter anyone from being vaccinated and there is no need to hold any of those medications prior to vaccine administration.   Currently, there is a hotline to call (active 09/17/19) to schedule vaccination appointments as no walk-ins will be accepted.    Vaccines through the health department can be arranged by calling [phone]    Vaccines through [facility] can be arranged by calling [phone] or visiting [email or web address]   If you have further questions or concerns about the vaccine process, please visit [email or web address], [email or web address], or contact your primary care physician.     ZIO XT- Long Term Monitor Instructions   Your physician has requested you wear your ZIO patch monitor 14 days.   This is a single patch monitor.  [employer] supplies one patch monitor per enrollment.  Additional stickers are not available.   Please do not apply patch if  you will be having a Nuclear Stress Test, Echocardiogram, Cardiac CT, MRI, or Chest Xray during the time frame you would be wearing the monitor. The patch cannot be worn during these tests.  You cannot remove and re-apply the ZIO XT patch monitor.   Your ZIO patch monitor will be  sent USPS Priority mail from [facility] directly to your home address. The monitor may also be mailed to a PO BOX if home delivery is not available.   It may take 3-5 days to receive your monitor after you have been enrolled.   Once you have received you monitor, please review enclosed instructions.  Your monitor has already been registered assigning a specific monitor serial # to you.   Applying the monitor   Shave hair from upper left chest.   Hold abrader disc by orange tab.  Rub abrader in 40 strokes over left upper chest as indicated in your monitor instructions.   Clean area with 4 enclosed alcohol pads .  Use all pads to assure are is cleaned thoroughly.  Let dry.   Apply patch as indicated in monitor instructions.  Patch will be place under collarbone on left side of chest with arrow pointing upward.   Rub patch adhesive wings for 2 minutes.Remove white label marked "1".  Remove white label marked "2".  Rub patch adhesive wings for 2 additional minutes.   While looking in a mirror, press and release button in center of patch.  A small green light will flash 3-4 times .  This will be your only indicator the monitor has been turned on.     Do not shower for the first 24 hours.  You may shower after the first 24 hours.   Press button if you feel a symptom. You will hear a small click.  Record Date, Time and Symptom in the Patient Log Book.   When you are ready to remove patch, follow instructions on last 2 pages of Patient Log Book.  Stick patch monitor onto last page of Patient Log Book.   Place Patient Log Book in [place] box.  Use locking tab on box and tape box closed securely.  The [employer] has [employer] on it.  Please place in mailbox as soon as possible.  Your physician should have your test results approximately 7 days after the monitor has been mailed back to [facility].   Call [facility] at [phone] if you have questions  regarding your ZIO XT patch monitor.  Call them immediately if you see an orange light blinking on your monitor.   If your monitor falls off in less than 4 days contact our Monitor department at [phone].  If your monitor becomes loose or falls off after 4 days call [employer] at [phone] for suggestions on securing your monitor.

## 2019-12-08 LAB — COMPREHENSIVE METABOLIC PANEL
ALT: 16 IU/L (ref 0–32)
AST: 17 IU/L (ref 0–40)
Albumin/Globulin Ratio: 2.5 — ABNORMAL HIGH (ref 1.2–2.2)
Albumin: 4.5 g/dL (ref 3.8–4.8)
Alkaline Phosphatase: 106 IU/L (ref 39–117)
BUN/Creatinine Ratio: 23 (ref 12–28)
BUN: 14 mg/dL (ref 8–27)
Bilirubin Total: <0.2 mg/dL (ref 0.0–1.2)
CO2: 25 mmol/L (ref 20–29)
Calcium: 9.3 mg/dL (ref 8.7–10.3)
Chloride: 104 mmol/L (ref 96–106)
Creatinine, Ser: 0.61 mg/dL (ref 0.57–1.00)
GFR calc Af Amer: 112 mL/min/{1.73_m2} (ref >59)
GFR calc non Af Amer: 97 mL/min/{1.73_m2} (ref >59)
Globulin, Total: 1.8 g/dL (ref 1.5–4.5)
Glucose: 87 mg/dL (ref 65–99)
Potassium: 4.2 mmol/L (ref 3.5–5.2)
Sodium: 142 mmol/L (ref 134–144)
Total Protein: 6.3 g/dL (ref 6.0–8.5)

## 2019-12-08 LAB — CBC
Hematocrit: 43 % (ref 34.0–46.6)
Hemoglobin: 14.3 g/dL (ref 11.1–15.9)
MCH: 30.4 pg (ref 26.6–33.0)
MCHC: 33.3 g/dL (ref 31.5–35.7)
MCV: 92 fL (ref 79–97)
Platelets: 288 10*3/uL (ref 150–450)
RBC: 4.7 x10E6/uL (ref 3.77–5.28)
RDW: 12.5 % (ref 11.7–15.4)
WBC: 7.7 10*3/uL (ref 3.4–10.8)

## 2019-12-08 LAB — TSH: TSH: 2.76 u[IU]/mL (ref 0.450–4.500)

## 2019-12-08 LAB — MAGNESIUM: Magnesium: 2.4 mg/dL — ABNORMAL HIGH (ref 1.6–2.3)

## 2019-12-10 ENCOUNTER — Ambulatory Visit (INDEPENDENT_AMBULATORY_CARE_PROVIDER_SITE_OTHER): Payer: BC Managed Care – PPO

## 2019-12-10 DIAGNOSIS — R002 Palpitations: Secondary | ICD-10-CM

## 2019-12-29 ENCOUNTER — Other Ambulatory Visit: Payer: Self-pay

## 2019-12-29 ENCOUNTER — Ambulatory Visit (HOSPITAL_COMMUNITY): Payer: BC Managed Care – PPO | Attending: Cardiology

## 2019-12-29 DIAGNOSIS — E785 Hyperlipidemia, unspecified: Secondary | ICD-10-CM | POA: Insufficient documentation

## 2019-12-29 DIAGNOSIS — I10 Essential (primary) hypertension: Secondary | ICD-10-CM | POA: Diagnosis not present

## 2019-12-29 DIAGNOSIS — R002 Palpitations: Secondary | ICD-10-CM

## 2019-12-29 DIAGNOSIS — Z8249 Family history of ischemic heart disease and other diseases of the circulatory system: Secondary | ICD-10-CM | POA: Diagnosis not present

## 2019-12-30 ENCOUNTER — Telehealth: Payer: Self-pay | Admitting: Cardiology

## 2019-12-30 NOTE — Telephone Encounter (Signed)
Patient returning Brittany's call in regards to echo results.

## 2019-12-30 NOTE — Telephone Encounter (Signed)
Daleen Bo I, RN  12/30/2019 10:40 AM EDT    The patient has been notified of the result and verbalized understanding. All questions (if any) were answered. Lattie Haw, RN 12/30/2019 10:39 AM    Lattie Haw, RN  12/30/2019 9:27 AM EDT    Left message for patient to call back.   Dyann Kief, PA-C  12/30/2019 8:39 AM EDT    Echo looks good with normal heart function, mild heart murmur but overall nothing causing her symptoms.

## 2020-01-05 ENCOUNTER — Telehealth: Payer: Self-pay | Admitting: Cardiology

## 2020-01-05 DIAGNOSIS — R002 Palpitations: Secondary | ICD-10-CM | POA: Diagnosis not present

## 2020-01-05 NOTE — Telephone Encounter (Signed)
Follow up   Pt is returning call from [facility]

## 2020-01-05 NOTE — Telephone Encounter (Signed)
[employer] called end of service report.  4/6 1:55 pm - HR 160 bpm (didn't say how long it lasted) 4/10 11:00 am - 51.7 second SVT episode, HR 210  Will forward to ordering provider for her FYI until final report is available for review.

## 2020-01-05 NOTE — Telephone Encounter (Signed)
Monitor reviewed with Dr. Anne Fu and several episodes of SVT and possibly ventricular tachycardia although could be SVT with aberrancy with fast rates.  He recommends appointment with EP.  I called patient and went over the results in detail with her.  She has had palpitations but no dizziness or presyncope.  Instructed her to go to the emergency room for prolonged episodes.  I told her to increase Toprol to 25 mg once daily.  Have scheduled her to see Dr. Ladona Ridgel in the office 01/11/2020 at 12:15.  Please cancel the appointment with Dr. Anne Fu on 01/18/2020

## 2020-01-05 NOTE — Telephone Encounter (Signed)
Left message for patient to call back  

## 2020-01-05 NOTE — Telephone Encounter (Signed)
[employer] was calling with an abnormal report for this patient. Please call back and use Reference # [id number]

## 2020-01-05 NOTE — Telephone Encounter (Signed)
Med list updated. Appointment with Dr. Anne Fu cancelled. Patient will keep appointment with Dr. Ladona Ridgel on 5/4.

## 2020-01-06 NOTE — Telephone Encounter (Signed)
Follow Up  Patient is calling in because she has more questions for the nurse. Please give patient a call back to assist.

## 2020-01-06 NOTE — Telephone Encounter (Signed)
Called and made the patient aware that she should avoid caffeine and alcohol. Patient states that she does not drink any alcohol but will decrease her caffeine consumption.

## 2020-01-11 ENCOUNTER — Encounter: Payer: Self-pay | Admitting: Internal Medicine

## 2020-01-11 ENCOUNTER — Ambulatory Visit (INDEPENDENT_AMBULATORY_CARE_PROVIDER_SITE_OTHER): Payer: BC Managed Care – PPO | Admitting: Internal Medicine

## 2020-01-11 ENCOUNTER — Other Ambulatory Visit: Payer: Self-pay

## 2020-01-11 VITALS — BP 124/76 | HR 76 | Ht 61.5 in | Wt 131.6 lb

## 2020-01-11 DIAGNOSIS — I471 Supraventricular tachycardia, unspecified: Secondary | ICD-10-CM

## 2020-01-11 DIAGNOSIS — R002 Palpitations: Secondary | ICD-10-CM | POA: Diagnosis not present

## 2020-01-11 HISTORY — DX: Supraventricular tachycardia, unspecified: I47.10

## 2020-01-11 HISTORY — DX: Supraventricular tachycardia: I47.1

## 2020-01-11 NOTE — Progress Notes (Signed)
HPI [name] is referred today by Amie Portland for evaluation of both wide and narrow QRS tachycardia. She is a pleasant otherwise healthy woman with HTN. Over the last year or two she has begun to experience palpitations which have increased in frequency and severity. She has never gone to the ED. She has worn a cardiac monitor recently which shows both narrow and wide QRS tachycardia. Rates are in the 200 range. These spells are not associated with exertion. No chest pain or syncope although she feels lightheaded when an episode occurs. She is sob.  Allergies  Allergen Reactions  . Amoxicillin     SKIN BURNING SENSATION  . Codeine     nausea  . Megace [Megestrol]   . Morphine And Related      Current Outpatient Medications  Medication Sig Dispense Refill  . aspirin 81 MG tablet Take 81 mg by mouth daily.    . Biotin 5000 MCG TABS TAKE 2 TABLETS BY MOUTH DAILY    . Cyanocobalamin (VITAMIN B 12 PO) Take 1,000 mcg by mouth daily.     . folic acid (FOLVITE) 1 MG tablet Take 400 mcg by mouth 2 (two) times a day.     Marland Kitchen MAGNESIUM CARBONATE PO Take 500 mg by mouth daily.     . metoprolol succinate (TOPROL-XL) 25 MG 24 hr tablet Take 25 mg by mouth daily.    . Multiple Vitamin (MULTIVITAMIN) tablet Take 1 tablet by mouth daily. Gummy    . VITAMIN D, CHOLECALCIFEROL, PO Take 1 capsule by mouth daily.     No current facility-administered medications for this visit.     Past Medical History:  Diagnosis Date  . Anxiety   . Depression   . History of CT scan    Coronary calcium CT 3/19: Calcium score 0  . Hyperlipidemia   . Polyp of colon 2009   benign  . PVC's (premature ventricular contractions)    a. prior event monitor 02/2014: NSR, rare PVC.  Marland Kitchen SAB (spontaneous abortion)   . Stress    situational  . Trisomy 18    prior pregnancy with Trisomy 18    ROS:   All systems reviewed and negative except as noted in the HPI.   Past Surgical History:  Procedure Laterality  Date  . CARPAL TUNNEL RELEASE Right 2004  . CESAREAN SECTION     x 2 ( 1 with trisomy 18)  . MYOMECTOMY    . TOTAL ABDOMINAL HYSTERECTOMY W/ BILATERAL SALPINGOOPHORECTOMY  03/04/2006     Family History  Problem Relation Age of Onset  . Hypertension Mother   . Heart attack Mother 90  . Hypertension Father   . Atrial fibrillation Father   . Lung cancer Father        LUNG  . Heart failure Father   . Stroke Neg Hx   . Breast cancer Neg Hx      Social History   Socioeconomic History  . Marital status: Widowed    Spouse name: Not on file  . Number of children: Not on file  . Years of education: Not on file  . Highest education level: Not on file  Occupational History  . Not on file  Tobacco Use  . Smoking status: Never Smoker  . Smokeless tobacco: Never Used  Substance and Sexual Activity  . Alcohol use: No  . Drug use: No  . Sexual activity: Never  Other Topics Concern  . Not on file  Social History Narrative  . Not on file   Social Determinants of Health   Financial Resource Strain:   . Difficulty of Paying Living Expenses:   Food Insecurity:   . Worried About Programme researcher, broadcasting/film/video in the Last Year:   . Barista in the Last Year:   Transportation Needs:   . Freight forwarder (Medical):   Marland Kitchen Lack of Transportation (Non-Medical):   Physical Activity:   . Days of Exercise per Week:   . Minutes of Exercise per Session:   Stress:   . Feeling of Stress :   Social Connections:   . Frequency of Communication with Friends and Family:   . Frequency of Social Gatherings with Friends and Family:   . Attends Religious Services:   . Active Member of Clubs or Organizations:   . Attends Banker Meetings:   Marland Kitchen Marital Status:   Intimate Partner Violence:   . Fear of Current or Ex-Partner:   . Emotionally Abused:   Marland Kitchen Physically Abused:   . Sexually Abused:      BP 124/76   Pulse 76   Ht 5' 1.5" (1.562 m)   Wt 131 lb 9.6 oz (59.7 kg)   SpO2  97%   BMI 24.46 kg/m   Physical Exam:  Well appearing 64 yo woman, NAD HEENT: Unremarkable Neck:  No JVD, no thyromegally Lymphatics:  No adenopathy Back:  No CVA tenderness Lungs:  Clear with no wheezes HEART:  Regular rate rhythm, no murmurs, no rubs, no clicks Abd:  soft, positive bowel sounds, no organomegally, no rebound, no guarding Ext:  2 plus pulses, no edema, no cyanosis, no clubbing Skin:  No rashes no nodules Neuro:  CN II through XII intact, motor grossly intact  EKG - NSR with no pre-excitation  Assess/Plan: 1. SVT - she has both narrow and wide QRS tachycardia. I strongly suspect both are SVT without and with abherration respectively. I have reviewed the indications/risks/benefits/goals/expectations of catheter ablation and she will call us if she wishes to proceed. 2. HTN - she has borderline high BP. I suspect if she decides to undergo ablation then we will switch her bp control to either a diuretic or afterload reduction.   Leonia Reeves.D.

## 2020-01-11 NOTE — Patient Instructions (Addendum)
Medication Instructions:  Your physician recommends that you continue on your current medications as directed. Please refer to the Current Medication list given to you today.  Labwork: None ordered.  Testing/Procedures: Your physician has recommended that you have an ablation. Catheter ablation is a medical procedure used to treat some cardiac arrhythmias (irregular heartbeats). During catheter ablation, a long, thin, flexible tube is put into a blood vessel in your groin (upper thigh), or neck. This tube is called an ablation catheter. It is then guided to your heart through the blood vessel. Radio frequency waves destroy small areas of heart tissue where abnormal heartbeats may cause an arrhythmia to start. Please see the instruction sheet given to you today.   Follow-Up: The following dates are available in June for your procedure:  June 3, 11, 14, 28  Any Other Special Instructions Will Be Listed Below (If Applicable).  If you need a refill on your cardiac medications before your next appointment, please call your pharmacy.    Cardiac Ablation Cardiac ablation is a procedure to disable (ablate) a small amount of heart tissue in very specific places. The heart has many electrical connections. Sometimes these connections are abnormal and can cause the heart to beat very fast or irregularly. Ablating some of the problem areas can improve the heart rhythm or return it to normal. Ablation may be done for people who:  Have Wolff-Parkinson-White syndrome.  Have fast heart rhythms (tachycardia).  Have taken medicines for an abnormal heart rhythm (arrhythmia) that were not effective or caused side effects.  Have a high-risk heartbeat that may be life-threatening. During the procedure, a small incision is made in the neck or the groin, and a long, thin, flexible tube (catheter) is inserted into the incision and moved to the heart. Small devices (electrodes) on the tip of the catheter will  send out electrical currents. A type of X-ray (fluoroscopy) will be used to help guide the catheter and to provide images of the heart. Tell a health care provider about:  Any allergies you have.  All medicines you are taking, including vitamins, herbs, eye drops, creams, and over-the-counter medicines.  Any problems you or family members have had with anesthetic medicines.  Any blood disorders you have.  Any surgeries you have had.  Any medical conditions you have, such as kidney failure.  Whether you are pregnant or may be pregnant. What are the risks? Generally, this is a safe procedure. However, problems may occur, including:  Infection.  Bruising and bleeding at the catheter insertion site.  Bleeding into the chest, especially into the sac that surrounds the heart. This is a serious complication.  Stroke or blood clots.  Damage to other structures or organs.  Allergic reaction to medicines or dyes.  Need for a permanent pacemaker if the normal electrical system is damaged. A pacemaker is a small computer that sends electrical signals to the heart and helps your heart beat normally.  The procedure not being fully effective. This may not be recognized until months later. Repeat ablation procedures are sometimes required. What happens before the procedure?  Follow instructions from your health care provider about eating or drinking restrictions.  Ask your health care provider about: ? Changing or stopping your regular medicines. This is especially important if you are taking diabetes medicines or blood thinners. ? Taking medicines such as aspirin and ibuprofen. These medicines can thin your blood. Do not take these medicines before your procedure if your health care provider instructs you not  to.  Plan to have someone take you home from the hospital or clinic.  If you will be going home right after the procedure, plan to have someone with you for 24 hours. What happens  during the procedure?  To lower your risk of infection: ? Your health care team will wash or sanitize their hands. ? Your skin will be washed with soap. ? Hair may be removed from the incision area.  An IV tube will be inserted into one of your veins.  You will be given a medicine to help you relax (sedative).  The skin on your neck or groin will be numbed.  An incision will be made in your neck or your groin.  A needle will be inserted through the incision and into a large vein in your neck or groin.  A catheter will be inserted into the needle and moved to your heart.  Dye may be injected through the catheter to help your surgeon see the area of the heart that needs treatment.  Electrical currents will be sent from the catheter to ablate heart tissue in desired areas. There are three types of energy that may be used to ablate heart tissue: ? Heat (radiofrequency energy). ? Laser energy. ? Extreme cold (cryoablation).  When the necessary tissue has been ablated, the catheter will be removed.  Pressure will be held on the catheter insertion area to prevent excessive bleeding.  A bandage (dressing) will be placed over the catheter insertion area. The procedure may vary among health care providers and hospitals. What happens after the procedure?  Your blood pressure, heart rate, breathing rate, and blood oxygen level will be monitored until the medicines you were given have worn off.  Your catheter insertion area will be monitored for bleeding. You will need to lie still for a few hours to ensure that you do not bleed from the catheter insertion area.  Do not drive for 24 hours or as long as directed by your health care provider. Summary  Cardiac ablation is a procedure to disable (ablate) a small amount of heart tissue in very specific places. Ablating some of the problem areas can improve the heart rhythm or return it to normal.  During the procedure, electrical currents  will be sent from the catheter to ablate heart tissue in desired areas. This information is not intended to replace advice given to you by your health care provider. Make sure you discuss any questions you have with your health care provider. Document Revised: 02/16/2018 Document Reviewed: 07/15/2016 Elsevier Patient Education  [place].

## 2020-01-18 ENCOUNTER — Ambulatory Visit: Payer: BC Managed Care – PPO | Admitting: Cardiology

## 2020-01-19 DIAGNOSIS — U071 COVID-19: Secondary | ICD-10-CM | POA: Diagnosis not present

## 2020-02-03 ENCOUNTER — Telehealth: Payer: Self-pay | Admitting: Internal Medicine

## 2020-02-03 DIAGNOSIS — I471 Supraventricular tachycardia: Secondary | ICD-10-CM

## 2020-02-03 NOTE — Telephone Encounter (Signed)
I spoke to the patient and she has decided to have the ablation after thinking it over.  I told her that I would have Dr Lubertha Basque nurse reach out to her on 5/28 to further discuss.  She verbalized understanding.

## 2020-02-03 NOTE — Telephone Encounter (Signed)
New Message  Pt called to talk with Dr. Ladona Ridgel or nurse in regards to an ablation procedure. Please call back

## 2020-02-08 NOTE — Telephone Encounter (Signed)
Patient following up. She is aware that Dr. Lubertha Basque nurse is off today and will be back tomorrow.

## 2020-02-10 NOTE — Telephone Encounter (Signed)
Call returned to Pt.  Pt is having more frequent episodes of heart racing with shortness of breath.  Advised Pt that if she had ablation on February 25, 2020 it would be ok for her to fly on March 03, 2020 to a wedding.  Pt scheduled for SVT ablation on June 18.  Will get labs/covid test June 15  Work up complete.  See instruction letter for details.

## 2020-02-22 ENCOUNTER — Other Ambulatory Visit: Payer: BC Managed Care – PPO

## 2020-02-22 ENCOUNTER — Other Ambulatory Visit (HOSPITAL_COMMUNITY): Payer: BC Managed Care – PPO

## 2020-02-22 ENCOUNTER — Other Ambulatory Visit: Payer: Self-pay

## 2020-02-22 DIAGNOSIS — I471 Supraventricular tachycardia: Secondary | ICD-10-CM | POA: Diagnosis not present

## 2020-02-22 LAB — CBC WITH DIFFERENTIAL/PLATELET
Basophils Absolute: 0 10*3/uL (ref 0.0–0.2)
Basos: 1 %
EOS (ABSOLUTE): 0.2 10*3/uL (ref 0.0–0.4)
Eos: 3 %
Hematocrit: 43.8 % (ref 34.0–46.6)
Hemoglobin: 14.8 g/dL (ref 11.1–15.9)
Lymphocytes Absolute: 1.7 10*3/uL (ref 0.7–3.1)
Lymphs: 29 %
MCH: 31.1 pg (ref 26.6–33.0)
MCHC: 33.8 g/dL (ref 31.5–35.7)
MCV: 92 fL (ref 79–97)
Monocytes Absolute: 0.6 10*3/uL (ref 0.1–0.9)
Monocytes: 10 %
Neutrophils Absolute: 3.4 10*3/uL (ref 1.4–7.0)
Neutrophils: 57 %
Platelets: 267 10*3/uL (ref 150–450)
RBC: 4.76 x10E6/uL (ref 3.77–5.28)
RDW: 13.6 % (ref 11.7–15.4)
WBC: 5.8 10*3/uL (ref 3.4–10.8)

## 2020-02-22 LAB — BASIC METABOLIC PANEL
BUN/Creatinine Ratio: 29 — ABNORMAL HIGH (ref 12–28)
BUN: 20 mg/dL (ref 8–27)
CO2: 30 mmol/L — ABNORMAL HIGH (ref 20–29)
Calcium: 9.8 mg/dL (ref 8.7–10.3)
Chloride: 103 mmol/L (ref 96–106)
Creatinine, Ser: 0.7 mg/dL (ref 0.57–1.00)
GFR calc Af Amer: 107 mL/min/{1.73_m2} (ref >59)
GFR calc non Af Amer: 93 mL/min/{1.73_m2} (ref >59)
Glucose: 76 mg/dL (ref 65–99)
Potassium: 4.4 mmol/L (ref 3.5–5.2)
Sodium: 139 mmol/L (ref 134–144)

## 2020-02-25 ENCOUNTER — Other Ambulatory Visit: Payer: Self-pay

## 2020-02-25 ENCOUNTER — Ambulatory Visit (HOSPITAL_COMMUNITY)
Admission: RE | Admit: 2020-02-25 | Discharge: 2020-02-25 | Disposition: A | Discharge status: Home / Self Care | Payer: BC Managed Care – PPO | Attending: Internal Medicine | Admitting: Internal Medicine

## 2020-02-25 ENCOUNTER — Encounter (HOSPITAL_COMMUNITY)
Admission: RE | Disposition: A | Discharge status: Home / Self Care | Payer: Self-pay | Source: Home / Self Care | Attending: Internal Medicine

## 2020-02-25 DIAGNOSIS — Z7982 Long term (current) use of aspirin: Secondary | ICD-10-CM | POA: Diagnosis not present

## 2020-02-25 DIAGNOSIS — F419 Anxiety disorder, unspecified: Secondary | ICD-10-CM | POA: Diagnosis not present

## 2020-02-25 DIAGNOSIS — I471 Supraventricular tachycardia: Secondary | ICD-10-CM | POA: Diagnosis not present

## 2020-02-25 DIAGNOSIS — Z885 Allergy status to narcotic agent status: Secondary | ICD-10-CM | POA: Diagnosis not present

## 2020-02-25 DIAGNOSIS — F329 Major depressive disorder, single episode, unspecified: Secondary | ICD-10-CM | POA: Diagnosis not present

## 2020-02-25 DIAGNOSIS — E785 Hyperlipidemia, unspecified: Secondary | ICD-10-CM | POA: Diagnosis not present

## 2020-02-25 DIAGNOSIS — Z880 Allergy status to penicillin: Secondary | ICD-10-CM | POA: Diagnosis not present

## 2020-02-25 DIAGNOSIS — Z79899 Other long term (current) drug therapy: Secondary | ICD-10-CM | POA: Insufficient documentation

## 2020-02-25 DIAGNOSIS — I10 Essential (primary) hypertension: Secondary | ICD-10-CM | POA: Diagnosis not present

## 2020-02-25 HISTORY — PX: SVT ABLATION: EP1225

## 2020-02-25 HISTORY — PX: ELECTROPHYSIOLOGY STUDY: EP1205

## 2020-02-25 SURGERY — SVT ABLATION

## 2020-02-25 MED ORDER — MIDAZOLAM HCL 5 MG/5ML IJ SOLN
INTRAMUSCULAR | Status: AC
  Filled 2020-02-25: qty 5

## 2020-02-25 MED ORDER — BUPIVACAINE HCL (PF) 0.25 % IJ SOLN
INTRAMUSCULAR | Status: DC | PRN
  Administered 2020-02-25: 60 mL

## 2020-02-25 MED ORDER — FENTANYL CITRATE (PF) 100 MCG/2ML IJ SOLN
INTRAMUSCULAR | Status: AC
  Filled 2020-02-25: qty 2

## 2020-02-25 MED ORDER — ONDANSETRON HCL 4 MG/2ML IJ SOLN: 4.0000 mg | Freq: Four times a day (QID) | INTRAMUSCULAR | Status: DC | PRN

## 2020-02-25 MED ORDER — HEPARIN (PORCINE) IN NACL 1000-0.9 UT/500ML-% IV SOLN
INTRAVENOUS | Status: DC | PRN
  Administered 2020-02-25 (×2): 500 mL

## 2020-02-25 MED ORDER — SODIUM CHLORIDE 0.9 % IV SOLN
INTRAVENOUS | Status: DC | PRN
  Administered 2020-02-25: 1 ug/min via INTRAVENOUS

## 2020-02-25 MED ORDER — FENTANYL CITRATE (PF) 100 MCG/2ML IJ SOLN
INTRAMUSCULAR | Status: DC | PRN
  Administered 2020-02-25 (×4): 12.5 ug via INTRAVENOUS
  Administered 2020-02-25: 25 ug via INTRAVENOUS
  Administered 2020-02-25: 12.5 ug via INTRAVENOUS
  Administered 2020-02-25 (×2): 25 ug via INTRAVENOUS

## 2020-02-25 MED ORDER — FLECAINIDE ACETATE 50 MG PO TABS
50.0000 mg | ORAL_TABLET | Freq: Two times a day (BID) | ORAL | 5 refills | Status: DC
Start: 2020-02-25 — End: 2020-09-12

## 2020-02-25 MED ORDER — SODIUM CHLORIDE 0.9 % IV SOLN: INTRAVENOUS | Status: DC

## 2020-02-25 MED ORDER — ISOPROTERENOL HCL 0.2 MG/ML IJ SOLN
INTRAMUSCULAR | Status: AC
  Filled 2020-02-25: qty 5

## 2020-02-25 MED ORDER — BUPIVACAINE HCL (PF) 0.25 % IJ SOLN
INTRAMUSCULAR | Status: AC
  Filled 2020-02-25: qty 60

## 2020-02-25 MED ORDER — MIDAZOLAM HCL 5 MG/5ML IJ SOLN
INTRAMUSCULAR | Status: DC | PRN
  Administered 2020-02-25: 2 mg via INTRAVENOUS
  Administered 2020-02-25: 1 mg via INTRAVENOUS
  Administered 2020-02-25: 2 mg via INTRAVENOUS
  Administered 2020-02-25: 1 mg via INTRAVENOUS
  Administered 2020-02-25: 2 mg via INTRAVENOUS
  Administered 2020-02-25 (×3): 1 mg via INTRAVENOUS

## 2020-02-25 MED ORDER — HEPARIN (PORCINE) IN NACL 1000-0.9 UT/500ML-% IV SOLN
INTRAVENOUS | Status: AC
  Filled 2020-02-25: qty 1000

## 2020-02-25 SURGICAL SUPPLY — 12 items
BAG SNAP BAND KOVER 36X36 (MISCELLANEOUS) ×1 IMPLANT
CATH HEX JOSEPH 2-5-2 65CM 6F (CATHETERS) ×1 IMPLANT
CATH JOSEPH QUAD ALLRED 6F REP (CATHETERS) ×1 IMPLANT
CATH JOSEPHSON QUAD-ALLRED 6FR (CATHETERS) ×1 IMPLANT
ELECT DEFIB PAD ADLT CADENCE (PAD) IMPLANT
PACK EP LATEX FREE (CUSTOM PROCEDURE TRAY) ×2
PACK EP LF (CUSTOM PROCEDURE TRAY) ×1 IMPLANT
PAD PRO RADIOLUCENT 2001M-C (PAD) ×2 IMPLANT
SHEATH PINNACLE 6F 10CM (SHEATH) ×2 IMPLANT
SHEATH PINNACLE 7F 10CM (SHEATH) ×1 IMPLANT
SHEATH PINNACLE 8F 10CM (SHEATH) ×1 IMPLANT
SHIELD RADPAD SCOOP 12X17 (MISCELLANEOUS) ×1 IMPLANT

## 2020-02-25 NOTE — Progress Notes (Signed)
Site area: Right  Groin a 6 french X2 and 8 french venous sheath was removed  Site Prior to Removal:  Level 0  Pressure Applied For 20 MINUTES    Bedrest Beginning at  0950am  Manual:   Yes.    Patient Status During Pull:  stable  Post Pull Groin Site:  Level 0  Post Pull Instructions Given:  Yes.    Post Pull Pulses Present:  Yes.    Dressing Applied:  Yes.    Comments:

## 2020-02-25 NOTE — H&P (Signed)
HPI [name] is referred today by Amie Portland for evaluation of both wide and narrow QRS tachycardia. She is a pleasant otherwise healthy woman with HTN. Over the last year or two she has begun to experience palpitations which have increased in frequency and severity. She has never gone to the ED. She has worn a cardiac monitor recently which shows both narrow and wide QRS tachycardia. Rates are in the 200 range. These spells are not associated with exertion. No chest pain or syncope although she feels lightheaded when an episode occurs. She is sob.  Allergies  Allergen Reactions  . Amoxicillin     SKIN BURNING SENSATION  . Codeine     nausea  . Megace [Megestrol]   . Morphine And Related            Current Outpatient Medications  Medication Sig Dispense Refill  . aspirin 81 MG tablet Take 81 mg by mouth daily.    . Biotin 5000 MCG TABS TAKE 2 TABLETS BY MOUTH DAILY    . Cyanocobalamin (VITAMIN B 12 PO) Take 1,000 mcg by mouth daily.     . folic acid (FOLVITE) 1 MG tablet Take 400 mcg by mouth 2 (two) times a day.     Marland Kitchen MAGNESIUM CARBONATE PO Take 500 mg by mouth daily.     . metoprolol succinate (TOPROL-XL) 25 MG 24 hr tablet Take 25 mg by mouth daily.    . Multiple Vitamin (MULTIVITAMIN) tablet Take 1 tablet by mouth daily. Gummy    . VITAMIN D, CHOLECALCIFEROL, PO Take 1 capsule by mouth daily.     No current facility-administered medications for this visit.         Past Medical History:  Diagnosis Date  . Anxiety   . Depression   . History of CT scan    Coronary calcium CT 3/19: Calcium score 0  . Hyperlipidemia   . Polyp of colon 2009   benign  . PVC's (premature ventricular contractions)    a. prior event monitor 02/2014: NSR, rare PVC.  Marland Kitchen SAB (spontaneous abortion)   . Stress    situational  . Trisomy 18    prior pregnancy with Trisomy 18    ROS:   All systems reviewed and negative except as  noted in the HPI.        Past Surgical History:  Procedure Laterality Date  . CARPAL TUNNEL RELEASE Right 2004  . CESAREAN SECTION     x 2 ( 1 with trisomy 18)  . MYOMECTOMY    . TOTAL ABDOMINAL HYSTERECTOMY W/ BILATERAL SALPINGOOPHORECTOMY  03/04/2006          Family History  Problem Relation Age of Onset  . Hypertension Mother   . Heart attack Mother 45  . Hypertension Father   . Atrial fibrillation Father   . Lung cancer Father        LUNG  . Heart failure Father   . Stroke Neg Hx   . Breast cancer Neg Hx      Social History        Socioeconomic History  . Marital status: Widowed    Spouse name: Not on file  . Number of children: Not on file  . Years of education: Not on file  . Highest education level: Not on file  Occupational History  . Not on file  Tobacco Use  . Smoking status: Never Smoker  . Smokeless tobacco: Never Used  Substance and  Sexual Activity  . Alcohol use: No  . Drug use: No  . Sexual activity: Never  Other Topics Concern  . Not on file  Social History Narrative  . Not on file   Social Determinants of Health      Financial Resource Strain:   . Difficulty of Paying Living Expenses:   Food Insecurity:   . Worried About Programme researcher, broadcasting/film/video in the Last Year:   . Barista in the Last Year:   Transportation Needs:   . Freight forwarder (Medical):   Marland Kitchen Lack of Transportation (Non-Medical):   Physical Activity:   . Days of Exercise per Week:   . Minutes of Exercise per Session:   Stress:   . Feeling of Stress :   Social Connections:   . Frequency of Communication with Friends and Family:   . Frequency of Social Gatherings with Friends and Family:   . Attends Religious Services:   . Active Member of Clubs or Organizations:   . Attends Banker Meetings:   Marland Kitchen Marital Status:   Intimate Partner Violence:   . Fear of Current or Ex-Partner:   . Emotionally Abused:   Marland Kitchen Physically  Abused:   . Sexually Abused:      BP 124/76   Pulse 76   Ht 5' 1.5" (1.562 m)   Wt 131 lb 9.6 oz (59.7 kg)   SpO2 97%   BMI 24.46 kg/m   Physical Exam:  Well appearing 64 yo woman, NAD HEENT: Unremarkable Neck:  No JVD, no thyromegally Lymphatics:  No adenopathy Back:  No CVA tenderness Lungs:  Clear with no wheezes HEART:  Regular rate rhythm, no murmurs, no rubs, no clicks Abd:  soft, positive bowel sounds, no organomegally, no rebound, no guarding Ext:  2 plus pulses, no edema, no cyanosis, no clubbing Skin:  No rashes no nodules Neuro:  CN II through XII intact, motor grossly intact  EKG - NSR with no pre-excitation  Assess/Plan: 1. SVT - she has both narrow and wide QRS tachycardia. I strongly suspect both are SVT without and with abherration respectively. I have reviewed the indications/risks/benefits/goals/expectations of catheter ablation and she will call us if she wishes to proceed. 2. HTN - she has borderline high BP. I suspect if she decides to undergo ablation then we will switch her bp control to either a diuretic or afterload reduction.   [name]  EP Attending  Patient seen and examined. Agree with above. The patient presents for EP study and catheter ablation. I have reviewed the indications/risks/benefits/goals/expectations of the procedure and she wishes to proceed.  [name].

## 2020-02-25 NOTE — Progress Notes (Signed)
Site area: Right IJ a 7 french venous sheath was removed  Site Prior to Removal:  Level 0  Pressure Applied For 5 MINUTES    Bedrest Beginning at 0950am  Manual:   Yes.    Patient Status During Pull:  stable  Post Pull Groin Site:  Level 0  Post Pull Instructions Given:  Yes.    Post Pull Pulses Present:  Yes.    Dressing Applied:  Yes.    Comments:

## 2020-02-25 NOTE — Discharge Instructions (Signed)

## 2020-02-28 ENCOUNTER — Encounter (HOSPITAL_COMMUNITY): Payer: Self-pay | Admitting: Internal Medicine

## 2020-02-29 ENCOUNTER — Telehealth: Payer: Self-pay | Admitting: Internal Medicine

## 2020-02-29 NOTE — Telephone Encounter (Signed)
Patient had an ablation on 02/25/20. She would like to speak with Dr. Ladona Ridgel or one of his assistant's in regards to the procedure. She is requesting an appt this week but I did not see anything open. Please advise.

## 2020-03-02 NOTE — Telephone Encounter (Signed)
See mychart thread.  Will plan to have Pt come in for EKG and provide further education.

## 2020-03-14 ENCOUNTER — Ambulatory Visit (INDEPENDENT_AMBULATORY_CARE_PROVIDER_SITE_OTHER): Payer: BC Managed Care – PPO

## 2020-03-14 ENCOUNTER — Other Ambulatory Visit: Payer: Self-pay

## 2020-03-14 VITALS — HR 60

## 2020-03-14 DIAGNOSIS — R002 Palpitations: Secondary | ICD-10-CM

## 2020-03-14 NOTE — Progress Notes (Signed)
1.) Reason for visit: 2 wk f/u s/p flecainide start  2.) Name of MD requesting visit: Dr. Ladona Ridgel  3.) H&P: Pt with recent attempted ablation.  Medical therapy started  4.) ROS related to problem: Pt states she has been doing well since starting flecainide.  States she sometimes has trouble catching her breath, and then realizes she is late with a dose of flecainide.    5.) Assessment and plan per MD: EKG obtained.  Will forward to Dr. Ladona Ridgel.  Reassurance given to Pt.  Follow up scheduled.

## 2020-04-04 ENCOUNTER — Other Ambulatory Visit: Payer: Self-pay

## 2020-04-04 ENCOUNTER — Encounter: Payer: Self-pay | Admitting: Internal Medicine

## 2020-04-04 ENCOUNTER — Ambulatory Visit: Payer: BC Managed Care – PPO | Admitting: Internal Medicine

## 2020-04-04 VITALS — BP 126/78 | HR 75 | Ht 61.5 in | Wt 130.0 lb

## 2020-04-04 DIAGNOSIS — R002 Palpitations: Secondary | ICD-10-CM

## 2020-04-04 DIAGNOSIS — I10 Essential (primary) hypertension: Secondary | ICD-10-CM

## 2020-04-04 NOTE — Progress Notes (Signed)
HPI [name] returns today for followup. She is a pleasant 64 yo woman with palpitations who underwent EP study but did not undergo catheter ablation as she was in NSR and only had very brief episodes of NS AT, and NSVT. She was started on low dose flecainide. She has undergone an ECG which showed NSR with a QRS of 94.  Allergies  Allergen Reactions  . Amoxicillin     SKIN BURNING SENSATION  . Codeine     nausea  . Morphine And Related Nausea And Vomiting  . Megace [Megestrol] Rash     Current Outpatient Medications  Medication Sig Dispense Refill  . aspirin 81 MG tablet Take 81 mg by mouth daily.    . Biotin 5000 MCG TABS Take 10,000 mcg by mouth daily.     . Cyanocobalamin (VITAMIN B 12 PO) Take 1,000 mcg by mouth daily.     . flecainide (TAMBOCOR) 50 MG tablet Take 1 tablet (50 mg total) by mouth 2 (two) times daily. Take as directed 60 tablet 5  . folic acid (FOLVITE) 400 MCG tablet Take 400 mcg by mouth 2 (two) times a day.     . Magnesium 250 MG TABS Take 250 mg by mouth daily.    . Multiple Vitamin (MULTIVITAMIN) tablet Take 1 tablet by mouth daily. Gummy    . Vitamin D, Cholecalciferol, 25 MCG (1000 UT) TABS Take 1,000 Units by mouth daily.      No current facility-administered medications for this visit.     Past Medical History:  Diagnosis Date  . Anxiety   . Depression   . History of CT scan    Coronary calcium CT 3/19: Calcium score 0  . HTN (hypertension) 05/21/2011  . Hypercholesterolemia 04/09/2010   Qualifier: Diagnosis of  By: Manson Passey CMA, Tresa Endo    . Hyperlipidemia   . Menopause 03/11/2012  . Palpitations 07/08/2017  . Polyp of colon 2009   benign  . PVC's (premature ventricular contractions)    a. prior event monitor 02/2014: NSR, rare PVC.  Marland Kitchen SAB (spontaneous abortion)   . Stress    situational  . SVT (supraventricular tachycardia) (HCC) 01/11/2020  . Tiredness 03/11/2012  . Trisomy 18    prior pregnancy with Trisomy 18    ROS:   All systems  reviewed and negative except as noted in the HPI.   Past Surgical History:  Procedure Laterality Date  . CARPAL TUNNEL RELEASE Right 2004  . CESAREAN SECTION     x 2 ( 1 with trisomy 18)  . ELECTROPHYSIOLOGY STUDY N/A 02/25/2020   Procedure: ELECTROPHYSIOLOGY STUDY;  Surgeon: Marinus Maw, MD;  Location: [facility] INVASIVE CV LAB;  Service: Cardiovascular;  Laterality: N/A;  . MYOMECTOMY    . SVT ABLATION N/A 02/25/2020   Procedure: SVT ABLATION;  Surgeon: Marinus Maw, MD;  Location: [facility] INVASIVE CV LAB;  Service: Cardiovascular;  Laterality: N/A;  . TOTAL ABDOMINAL HYSTERECTOMY W/ BILATERAL SALPINGOOPHORECTOMY  03/04/2006     Family History  Problem Relation Age of Onset  . Hypertension Mother   . Heart attack Mother 83  . Hypertension Father   . Atrial fibrillation Father   . Lung cancer Father        LUNG  . Heart failure Father   . Stroke Neg Hx   . Breast cancer Neg Hx      Social History   Socioeconomic History  . Marital status: Widowed    Spouse name: Not  on file  . Number of children: Not on file  . Years of education: Not on file  . Highest education level: Not on file  Occupational History  . Not on file  Tobacco Use  . Smoking status: Never Smoker  . Smokeless tobacco: Never Used  Vaping Use  . Vaping Use: Never used  Substance and Sexual Activity  . Alcohol use: No  . Drug use: No  . Sexual activity: Never  Other Topics Concern  . Not on file  Social History Narrative  . Not on file   Social Determinants of Health   Financial Resource Strain:   . Difficulty of Paying Living Expenses:   Food Insecurity:   . Worried About Programme researcher, broadcasting/film/video in the Last Year:   . Barista in the Last Year:   Transportation Needs:   . Freight forwarder (Medical):   Marland Kitchen Lack of Transportation (Non-Medical):   Physical Activity:   . Days of Exercise per Week:   . Minutes of Exercise per Session:   Stress:   . Feeling of Stress :   Social  Connections:   . Frequency of Communication with Friends and Family:   . Frequency of Social Gatherings with Friends and Family:   . Attends Religious Services:   . Active Member of Clubs or Organizations:   . Attends Banker Meetings:   Marland Kitchen Marital Status:   Intimate Partner Violence:   . Fear of Current or Ex-Partner:   . Emotionally Abused:   Marland Kitchen Physically Abused:   . Sexually Abused:      BP 126/78   Pulse 75   Ht 5' 1.5" (1.562 m)   Wt 130 lb (59 kg)   SpO2 98%   BMI 24.17 kg/m   Physical Exam:  Well appearing NAD HEENT: Unremarkable Neck:  No JVD, no thyromegally Lymphatics:  No adenopathy Back:  No CVA tenderness Lungs:  Clear with no wheezes HEART:  Regular rate rhythm, no murmurs, no rubs, no clicks Abd:  soft, positive bowel sounds, no organomegally, no rebound, no guarding Ext:  2 plus pulses, no edema, no cyanosis, no clubbing Skin:  No rashes no nodules Neuro:  CN II through XII intact, motor grossly intact  EKG - nsr   Assess/Plan: 1. Palpitations - her symptoms have resolved. She appears to be maintaining NSR. No change in meds. 2. Sob - she appears to get sob with her palpitations. Her dyspnea is much improved since starting the flecainide.  3. HTN - her bp is well controlled. She will continue her current meds.  Leonia Reeves.D.

## 2020-04-04 NOTE — Patient Instructions (Addendum)
Medication Instructions:  Your physician recommends that you continue on your current medications as directed. Please refer to the Current Medication list given to you today.  *If you need a refill on your cardiac medications before your next appointment, please call your pharmacy*  Lab Work: None ordered.  If you have labs (blood work) drawn today and your tests are completely normal, you will receive your results only by: . MyChart Message (if you have MyChart) OR . A paper copy in the mail If you have any lab test that is abnormal or we need to change your treatment, we will call you to review the results.  Testing/Procedures: None ordered.  Follow-Up: At [facility], you and your health needs are our priority.  As part of our continuing mission to provide you with exceptional heart care, we have created designated Provider Care Teams.  These Care Teams include your primary Cardiologist (physician) and Advanced Practice Providers (APPs -  Physician Assistants and Nurse Practitioners) who all work together to provide you with the care you need, when you need it.  We recommend signing up for the patient portal called "MyChart".  Sign up information is provided on this After Visit Summary.  MyChart is used to connect with patients for Virtual Visits (Telemedicine).  Patients are able to view lab/test results, encounter notes, upcoming appointments, etc.  Non-urgent messages can be sent to your provider as well.   To learn more about what you can do with MyChart, go to [email or web address].    Your next appointment:   Your physician wants you to follow-up in: 6 months with Dr. Taylor. You will receive a reminder letter in the mail two months in advance. If you don't receive a letter, please call our office to schedule the follow-up appointment.    Other Instructions:  

## 2020-04-07 NOTE — Addendum Note (Signed)
Addended by: Judie Hollick H on: 04/07/2020 09:41 AM   Modules accepted: Orders  

## 2020-04-25 DIAGNOSIS — I83893 Varicose veins of bilateral lower extremities with other complications: Secondary | ICD-10-CM | POA: Diagnosis not present

## 2020-04-25 DIAGNOSIS — I83813 Varicose veins of bilateral lower extremities with pain: Secondary | ICD-10-CM | POA: Diagnosis not present

## 2020-04-30 DIAGNOSIS — Z20828 Contact with and (suspected) exposure to other viral communicable diseases: Secondary | ICD-10-CM | POA: Diagnosis not present

## 2020-05-19 DIAGNOSIS — I83813 Varicose veins of bilateral lower extremities with pain: Secondary | ICD-10-CM | POA: Diagnosis not present

## 2020-05-19 DIAGNOSIS — I83893 Varicose veins of bilateral lower extremities with other complications: Secondary | ICD-10-CM | POA: Diagnosis not present

## 2020-05-30 DIAGNOSIS — I83813 Varicose veins of bilateral lower extremities with pain: Secondary | ICD-10-CM | POA: Diagnosis not present

## 2020-05-30 DIAGNOSIS — I83893 Varicose veins of bilateral lower extremities with other complications: Secondary | ICD-10-CM | POA: Diagnosis not present

## 2020-06-08 ENCOUNTER — Ambulatory Visit (INDEPENDENT_AMBULATORY_CARE_PROVIDER_SITE_OTHER): Payer: BC Managed Care – PPO | Admitting: Cardiology

## 2020-06-08 ENCOUNTER — Encounter: Payer: Self-pay | Admitting: Cardiology

## 2020-06-08 ENCOUNTER — Other Ambulatory Visit: Payer: Self-pay

## 2020-06-08 VITALS — BP 148/84 | HR 78 | Ht 61.5 in | Wt 137.8 lb

## 2020-06-08 DIAGNOSIS — R0602 Shortness of breath: Secondary | ICD-10-CM | POA: Diagnosis not present

## 2020-06-08 DIAGNOSIS — I471 Supraventricular tachycardia: Secondary | ICD-10-CM

## 2020-06-08 DIAGNOSIS — I10 Essential (primary) hypertension: Secondary | ICD-10-CM

## 2020-06-08 DIAGNOSIS — R002 Palpitations: Secondary | ICD-10-CM

## 2020-06-08 NOTE — Patient Instructions (Addendum)
Medication Instructions:  The current medical regimen is effective;  continue present plan and medications.  *If you need a refill on your cardiac medications before your next appointment, please call your pharmacy*  Testing/Procedures: Your physician has requested that you have an exercise tolerance test. For further information please visit [email or web address] Please also follow instruction sheet, as given.  Due to recent COVID-19 restrictions implemented by our local and state authorities and in an effort to keep both patients and staff as safe as possible, our hospital system requires COVID-19 testing prior to certain scheduled hospital procedures.  Please go to [address] [facility] [place], [place] [address] on     at      .  This is a drive up testing site.  You will not need to exit your vehicle.  You will not be billed at the time of testing but may receive a bill later depending on your insurance.  The approximate cost of the test is $100.  You must agree to self-quarantine from the time of your testing until the procedure date on     .  This should included staying home with ONLY the people you live with.  Avoid take-out, grocery store shopping or leaving the house for any non-emergent reason.  Failure to have your COVID-19 test done on the date and time you have been scheduled will result in cancellation of your procedure.  Please call our office at [phone] if you have any questions.  You have been referred to [facility].  Follow-Up: At [facility], you and your health needs are our priority.  As part of our continuing mission to provide you with exceptional heart care, we have created designated Provider Care Teams.  These Care Teams include your primary Cardiologist (physician) and Advanced Practice Providers (APPs -  Physician Assistants and Nurse Practitioners) who all work together to provide you with the care you need, when you need it.  We recommend signing up for the  patient portal called "MyChart".  Sign up information is provided on this After Visit Summary.  MyChart is used to connect with patients for Virtual Visits (Telemedicine).  Patients are able to view lab/test results, encounter notes, upcoming appointments, etc.  Non-urgent messages can be sent to your provider as well.   To learn more about what you can do with MyChart, go to [email or web address].    Your next appointment:   6 month(s)  The format for your next appointment:   In Person  Provider:   Donato Schultz, MD   Thank you for choosing [facility]!!     Please keep a check of your blood pressure at home.

## 2020-06-08 NOTE — Progress Notes (Signed)
Cardiology Office Note:    Date:  06/08/2020   ID:  [name], DOB [birth year], MRN [id number]  PCP:  Pearson Grippe, MD  [facility] Cardiologist:  Donato Schultz, MD  [facility] Electrophysiologist:  None   Referring MD: Pearson Grippe, MD     History of Present Illness:    [name] is a 64 y.o. female here for follow-up, had EP study by Dr. Ladona Ridgel but did not undergo catheter ablation as she was in normal sinus rhythm and only had brief episodes of atrial tachycardia nonsustained.  She was started on low-dose flecainide to help suppress.  QRS duration was 94.  She was referred initially by Wynell Balloon for evaluation of both wide and narrow QRS tachycardia.  Over the last year to begin experience these palpitations.  Rates at times were in the 200s on monitoring.  No syncope although feels like headed.  Dr. Ladona Ridgel suspicion was that both the narrow and wide complex tachycardia were likely both SVT with and without abberant conduction.  She started flecainide 50 mg twice a day.  Since doing that, her palpitations have resolved.  She does state that she does have some constipation.  She is taking MiraLAX occasionally which helps.  She also eats increased fiber in her diet.  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     Past Medical History:  Diagnosis Date  . Anxiety   . Depression   . History of CT scan    Coronary calcium CT 3/19: Calcium score 0  . HTN (hypertension) 05/21/2011  . Hypercholesterolemia 04/09/2010   Qualifier: Diagnosis of  By: Manson Passey CMA, Tresa Endo    . Hyperlipidemia   . Menopause 03/11/2012  . Palpitations 07/08/2017  . Polyp of colon 2009   benign  . PVC's (premature ventricular contractions)    a. prior event monitor 02/2014: NSR, rare PVC.  Marland Kitchen SAB (spontaneous abortion)   . Stress    situational  . SVT (supraventricular tachycardia) (HCC) 01/11/2020  . Tiredness 03/11/2012  . Trisomy 18    prior pregnancy with Trisomy 64    Past Surgical History:  Procedure  Laterality Date  . CARPAL TUNNEL RELEASE Right 2004  . CESAREAN SECTION     x 2 ( 1 with trisomy 18)  . ELECTROPHYSIOLOGY STUDY N/A 02/25/2020   Procedure: ELECTROPHYSIOLOGY STUDY;  Surgeon: Marinus Maw, MD;  Location: [facility] INVASIVE CV LAB;  Service: Cardiovascular;  Laterality: N/A;  . MYOMECTOMY    . SVT ABLATION N/A 02/25/2020   Procedure: SVT ABLATION;  Surgeon: Marinus Maw, MD;  Location: [facility] INVASIVE CV LAB;  Service: Cardiovascular;  Laterality: N/A;  . TOTAL ABDOMINAL HYSTERECTOMY W/ BILATERAL SALPINGOOPHORECTOMY  03/04/2006    Current Medications: Current Meds  Medication Sig  . aspirin 81 MG tablet Take 81 mg by mouth daily.  . Biotin 5000 MCG TABS Take 10,000 mcg by mouth daily.   . Cyanocobalamin (VITAMIN B 12 PO) Take 1,000 mcg by mouth daily.   . flecainide (TAMBOCOR) 50 MG tablet Take 1 tablet (50 mg total) by mouth 2 (two) times daily. Take as directed  . folic acid (FOLVITE) 400 MCG tablet Take 400 mcg by mouth 2 (two) times a day.   . Magnesium 250 MG TABS Take 250 mg by mouth daily.  . Multiple Vitamin (MULTIVITAMIN) tablet Take 1 tablet by mouth daily. Gummy  . Vitamin D, Cholecalciferol, 25 MCG (1000 UT) TABS Take 1,000 Units by mouth daily.  Allergies:   Amoxicillin, Codeine, Morphine and related, and Megace [megestrol]   Social History   Socioeconomic History  . Marital status: Widowed    Spouse name: Not on file  . Number of children: Not on file  . Years of education: Not on file  . Highest education level: Not on file  Occupational History  . Not on file  Tobacco Use  . Smoking status: Never Smoker  . Smokeless tobacco: Never Used  Vaping Use  . Vaping Use: Never used  Substance and Sexual Activity  . Alcohol use: No  . Drug use: No  . Sexual activity: Never  Other Topics Concern  . Not on file  Social History Narrative  . Not on file   Social Determinants of Health   Financial Resource Strain:   . Difficulty of Paying Living  Expenses: Not on file  Food Insecurity:   . Worried About Programme researcher, broadcasting/film/video in the Last Year: Not on file  . Ran Out of Food in the Last Year: Not on file  Transportation Needs:   . Lack of Transportation (Medical): Not on file  . Lack of Transportation (Non-Medical): Not on file  Physical Activity:   . Days of Exercise per Week: Not on file  . Minutes of Exercise per Session: Not on file  Stress:   . Feeling of Stress : Not on file  Social Connections:   . Frequency of Communication with Friends and Family: Not on file  . Frequency of Social Gatherings with Friends and Family: Not on file  . Attends Religious Services: Not on file  . Active Member of Clubs or Organizations: Not on file  . Attends Banker Meetings: Not on file  . Marital Status: Not on file     Family History: The patient's family history includes Atrial fibrillation in her father; Heart attack (age of onset: 16) in her mother; Heart failure in her father; Hypertension in her father and mother; Lung cancer in her father. There is no history of Stroke or Breast cancer.  ROS:   Please see the history of present illness.     All other systems reviewed and are negative.  EKGs/Labs/Other Studies Reviewed:      EKG:  EKG is  ordered today.  The ekg ordered today demonstrates 67 bpm sinus rhythm with QRS duration of 94 ms.  Recent Labs: 12/07/2019: ALT 16; Magnesium 2.4; TSH 2.760 02/22/2020: BUN 20; Creatinine, Ser 0.70; Hemoglobin 14.8; Platelets 267; Potassium 4.4; Sodium 139  Recent Lipid Panel    Component Value Date/Time   CHOL 226 (H) 07/09/2017 1027   TRIG 91 07/09/2017 1027   HDL 67 07/09/2017 1027   CHOLHDL 3.4 07/09/2017 1027   CHOLHDL 4 06/21/2013 0924   VLDL 26.0 06/21/2013 0924   LDLCALC 141 (H) 07/09/2017 1027   LDLDIRECT 160.4 06/21/2013 0924    Physical Exam:    VS:  BP (!) 148/84   Pulse 78   Ht 5' 1.5" (1.562 m)   Wt 137 lb 12.8 oz (62.5 kg)   SpO2 97%   BMI 25.62  kg/m     Wt Readings from Last 3 Encounters:  06/08/20 137 lb 12.8 oz (62.5 kg)  04/04/20 130 lb (59 kg)  02/25/20 128 lb (58.1 kg)     GEN:  Well nourished, well developed in no acute distress HEENT: Normal NECK: No JVD; No carotid bruits LYMPHATICS: No lymphadenopathy CARDIAC: RRR, no murmurs, rubs, gallops RESPIRATORY:  Clear to  auscultation without rales, wheezing or rhonchi  ABDOMEN: Soft, non-tender, non-distended MUSCULOSKELETAL:  No edema; No deformity  SKIN: Warm and dry NEUROLOGIC:  Alert and oriented x 3 PSYCHIATRIC:  Normal affect   ASSESSMENT:    1. Palpitations   2. Essential hypertension   3. Shortness of breath   4. SVT (supraventricular tachycardia) (HCC)    PLAN:    In order of problems listed above:  Palpitations/paroxysmal atrial tachycardia -Dr. Ladona Ridgel had started low-dose flecainide 50 twice daily, maintaining sinus rhythm.  She has not any further palpitations.  She was worried about potential side effects, we discussed.  Constipation very rare with this medication.  MiraLAX. -We will check an exercise treadmill test to ensure that she does not have any adverse arrhythmias with flecainide use.  Ongoing dyspnea -She is worried that since her August 2020 Covid infection that she still feels somewhat short of breath.  She was asking to see the pulmonary docs.  We will send in a referral. -Hemoglobin 14.8 TSH 2.7 creatinine 0.7 LDL 141 Essential hypertension -Blood pressure had been reasonably controlled.  Had stopped Toprol in the past.  Seems to be mildly elevated today.  Usually at home it is less than 130 systolic.  Continue to monitor.   Medication Adjustments/Labs and Tests Ordered: Current medicines are reviewed at length with the patient today.  Concerns regarding medicines are outlined above.  Orders Placed This Encounter  Procedures  . Ambulatory referral to Pulmonology  . EXERCISE TOLERANCE TEST (ETT)  . EKG 12-Lead   No orders of the  defined types were placed in this encounter.   Patient Instructions  Medication Instructions:  The current medical regimen is effective;  continue present plan and medications.  *If you need a refill on your cardiac medications before your next appointment, please call your pharmacy*  Testing/Procedures: Your physician has requested that you have an exercise tolerance test. For further information please visit [email or web address] Please also follow instruction sheet, as given.  Due to recent COVID-19 restrictions implemented by our local and state authorities and in an effort to keep both patients and staff as safe as possible, our hospital system requires COVID-19 testing prior to certain scheduled hospital procedures.  Please go to [phone] [facility] [place], [place] [address] on     at      .  This is a drive up testing site.  You will not need to exit your vehicle.  You will not be billed at the time of testing but may receive a bill later depending on your insurance.  The approximate cost of the test is $100.  You must agree to self-quarantine from the time of your testing until the procedure date on     .  This should included staying home with ONLY the people you live with.  Avoid take-out, grocery store shopping or leaving the house for any non-emergent reason.  Failure to have your COVID-19 test done on the date and time you have been scheduled will result in cancellation of your procedure.  Please call our office at [phone] if you have any questions.  You have been referred to [facility].  Follow-Up: At [facility], you and your health needs are our priority.  As part of our continuing mission to provide you with exceptional heart care, we have created designated Provider Care Teams.  These Care Teams include your primary Cardiologist (physician) and Advanced Practice Providers (APPs -  Physician Assistants and Nurse Practitioners) who all work  together to provide you with  the care you need, when you need it.  We recommend signing up for the patient portal called "MyChart".  Sign up information is provided on this After Visit Summary.  MyChart is used to connect with patients for Virtual Visits (Telemedicine).  Patients are able to view lab/test results, encounter notes, upcoming appointments, etc.  Non-urgent messages can be sent to your provider as well.   To learn more about what you can do with MyChart, go to [email or web address].    Your next appointment:   6 month(s)  The format for your next appointment:   In Person  Provider:   Donato Schultz, MD   Thank you for choosing [facility]!!     Please keep a check of your blood pressure at home.    Signed, Donato Schultz, MD  06/08/2020 12:09 PM    [facility]

## 2020-06-09 ENCOUNTER — Ambulatory Visit (INDEPENDENT_AMBULATORY_CARE_PROVIDER_SITE_OTHER): Payer: BC Managed Care – PPO | Admitting: Pulmonary Disease

## 2020-06-09 ENCOUNTER — Encounter: Payer: Self-pay | Admitting: Pulmonary Disease

## 2020-06-09 DIAGNOSIS — R0602 Shortness of breath: Secondary | ICD-10-CM

## 2020-06-09 NOTE — Patient Instructions (Signed)
We will schedule high-res CT and PFTs for evaluation of your lungs  Follow-up in 1 to 2 months for review.

## 2020-06-09 NOTE — Progress Notes (Signed)
[name]    [id number]    20-Jun-1956  Primary Care Physician:Kim, Fayrene Fearing, MD  Referring Physician: Jake Bathe, MD [phone] [address] [place],  [place] [address]  Chief complaint:  Consult for dyspnea  HPI: 64 year old with history of hypertension, SVT next Diagnosed with COVID-19 in August 2020.  She did not require hospitalization.  Complains of ongoing dyspnea since then.  She has symptoms with exertion and sometimes at rest especially at night. She had a chest x-ray at her primary care immediately after her Covid infection which was read as mild interstitial abnormality.  Follow-up chest x-ray was reportedly normal.  I do not have these images to review.  Evaluated by cardiology for SVT in early 2021.  Underwent ablation in June 2021 and is currently on flecainide.  Pets: Has a dog, she has a [place] since early 2010 Occupation: Hairdresser Exposures: Exposed to Conservator, museum/gallery.  No mold, hot tub, Jacuzzi.  No feather pillows or comforters Smoking history: Never smoker Travel history: No significant travel history Relevant family history: Father had lung cancer.  He was a smoker.  Outpatient Encounter Medications as of 06/09/2020  Medication Sig  . aspirin 81 MG tablet Take 81 mg by mouth daily.  . Biotin 5000 MCG TABS Take 10,000 mcg by mouth daily.   . Cyanocobalamin (VITAMIN B 12 PO) Take 1,000 mcg by mouth daily.   . flecainide (TAMBOCOR) 50 MG tablet Take 1 tablet (50 mg total) by mouth 2 (two) times daily. Take as directed  . folic acid (FOLVITE) 400 MCG tablet Take 400 mcg by mouth 2 (two) times a day.   . Magnesium 250 MG TABS Take 250 mg by mouth daily.  . Multiple Vitamin (MULTIVITAMIN) tablet Take 1 tablet by mouth daily. Gummy  . Vitamin D, Cholecalciferol, 25 MCG (1000 UT) TABS Take 1,000 Units by mouth daily.    No facility-administered encounter medications on file as of 06/09/2020.    Allergies as of 06/09/2020 - Review  Complete 06/09/2020  Allergen Reaction Noted  . Amoxicillin  04/09/2010  . Codeine  05/17/2011  . Morphine and related Nausea And Vomiting 02/28/2012  . Megace [megestrol] Rash 02/28/2012    Past Medical History:  Diagnosis Date  . Anxiety   . Depression   . History of CT scan    Coronary calcium CT 3/19: Calcium score 0  . HTN (hypertension) 05/21/2011  . Hypercholesterolemia 04/09/2010   Qualifier: Diagnosis of  By: Manson Passey CMA, Tresa Endo    . Hyperlipidemia   . Menopause 03/11/2012  . Palpitations 07/08/2017  . Polyp of colon 2009   benign  . PVC's (premature ventricular contractions)    a. prior event monitor 02/2014: NSR, rare PVC.  Marland Kitchen SAB (spontaneous abortion)   . Stress    situational  . SVT (supraventricular tachycardia) (HCC) 01/11/2020  . Tiredness 03/11/2012  . Trisomy 18    prior pregnancy with Trisomy 18    Past Surgical History:  Procedure Laterality Date  . CARPAL TUNNEL RELEASE Right 2004  . CESAREAN SECTION     x 2 ( 1 with trisomy 18)  . ELECTROPHYSIOLOGY STUDY N/A 02/25/2020   Procedure: ELECTROPHYSIOLOGY STUDY;  Surgeon: Marinus Maw, MD;  Location: [facility] INVASIVE CV LAB;  Service: Cardiovascular;  Laterality: N/A;  . MYOMECTOMY    . SVT ABLATION N/A 02/25/2020   Procedure: SVT ABLATION;  Surgeon: Marinus Maw, MD;  Location: [facility] INVASIVE CV LAB;  Service: Cardiovascular;  Laterality: N/A;  . TOTAL ABDOMINAL HYSTERECTOMY W/ BILATERAL SALPINGOOPHORECTOMY  03/04/2006    Family History  Problem Relation Age of Onset  . Hypertension Mother   . Heart attack Mother 44  . Hypertension Father   . Atrial fibrillation Father   . Lung cancer Father        LUNG  . Heart failure Father   . Stroke Neg Hx   . Breast cancer Neg Hx     Social History   Socioeconomic History  . Marital status: Widowed    Spouse name: Not on file  . Number of children: Not on file  . Years of education: Not on file  . Highest education level: Not on file  Occupational History  .  Not on file  Tobacco Use  . Smoking status: Never Smoker  . Smokeless tobacco: Never Used  Vaping Use  . Vaping Use: Never used  Substance and Sexual Activity  . Alcohol use: No  . Drug use: No  . Sexual activity: Never  Other Topics Concern  . Not on file  Social History Narrative  . Not on file   Social Determinants of Health   Financial Resource Strain:   . Difficulty of Paying Living Expenses: Not on file  Food Insecurity:   . Worried About Programme researcher, broadcasting/film/video in the Last Year: Not on file  . Ran Out of Food in the Last Year: Not on file  Transportation Needs:   . Lack of Transportation (Medical): Not on file  . Lack of Transportation (Non-Medical): Not on file  Physical Activity:   . Days of Exercise per Week: Not on file  . Minutes of Exercise per Session: Not on file  Stress:   . Feeling of Stress : Not on file  Social Connections:   . Frequency of Communication with Friends and Family: Not on file  . Frequency of Social Gatherings with Friends and Family: Not on file  . Attends Religious Services: Not on file  . Active Member of Clubs or Organizations: Not on file  . Attends Banker Meetings: Not on file  . Marital Status: Not on file  Intimate Partner Violence:   . Fear of Current or Ex-Partner: Not on file  . Emotionally Abused: Not on file  . Physically Abused: Not on file  . Sexually Abused: Not on file    Review of systems: Review of Systems  Constitutional: Negative for fever and chills.  HENT: Negative.   Eyes: Negative for blurred vision.  Respiratory: as per HPI  Cardiovascular: Negative for chest pain and palpitations.  Gastrointestinal: Negative for vomiting, diarrhea, blood per rectum. Genitourinary: Negative for dysuria, urgency, frequency and hematuria.  Musculoskeletal: Negative for myalgias, back pain and joint pain.  Skin: Negative for itching and rash.  Neurological: Negative for dizziness, tremors, focal weakness, seizures  and loss of consciousness.  Endo/Heme/Allergies: Negative for environmental allergies.  Psychiatric/Behavioral: Negative for depression, suicidal ideas and hallucinations.  All other systems reviewed and are negative.  Physical Exam: Blood pressure 138/82, pulse 84, temperature (!) 97 F (36.1 C), temperature source Skin, height 5\' 1"  (1.549 m), weight 139 lb 3.2 oz (63.1 kg), SpO2 99 %. 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 History notable for ongoing exposure to birds and will need evaluation for hypersensitivity pneumonitis May have an element of deconditioning after her Covid infection with Covid long-haul syndrome  We will get high-res CT and PFTs for better evaluation of lungs  Plan/Recommendations: High-res CT, PFTs  Chilton Greathouse MD [facility] 06/09/2020, 9:19 AM  CC: Jake Bathe, MD

## 2020-06-23 ENCOUNTER — Other Ambulatory Visit (HOSPITAL_COMMUNITY)
Admission: RE | Admit: 2020-06-23 | Discharge: 2020-06-23 | Disposition: A | Discharge status: Home / Self Care | Payer: BC Managed Care – PPO | Source: Ambulatory Visit | Attending: Cardiology | Admitting: Cardiology

## 2020-06-23 DIAGNOSIS — Z01812 Encounter for preprocedural laboratory examination: Secondary | ICD-10-CM | POA: Diagnosis not present

## 2020-06-23 DIAGNOSIS — Z20822 Contact with and (suspected) exposure to covid-19: Secondary | ICD-10-CM | POA: Insufficient documentation

## 2020-06-24 LAB — SARS CORONAVIRUS 2 (TAT 6-24 HRS): SARS Coronavirus 2: NEGATIVE

## 2020-06-27 ENCOUNTER — Other Ambulatory Visit: Payer: Self-pay

## 2020-06-27 ENCOUNTER — Ambulatory Visit (INDEPENDENT_AMBULATORY_CARE_PROVIDER_SITE_OTHER): Payer: BC Managed Care – PPO

## 2020-06-27 DIAGNOSIS — I471 Supraventricular tachycardia: Secondary | ICD-10-CM

## 2020-06-27 DIAGNOSIS — R0602 Shortness of breath: Secondary | ICD-10-CM | POA: Diagnosis not present

## 2020-06-27 DIAGNOSIS — R002 Palpitations: Secondary | ICD-10-CM

## 2020-06-27 DIAGNOSIS — L719 Rosacea, unspecified: Secondary | ICD-10-CM | POA: Diagnosis not present

## 2020-06-27 DIAGNOSIS — D239 Other benign neoplasm of skin, unspecified: Secondary | ICD-10-CM | POA: Diagnosis not present

## 2020-06-27 DIAGNOSIS — D2239 Melanocytic nevi of other parts of face: Secondary | ICD-10-CM | POA: Diagnosis not present

## 2020-06-27 LAB — EXERCISE TOLERANCE TEST
Estimated workload: 13.4 METS
Exercise duration (min): 10 min
Exercise duration (sec): 0 s
MPHR: 157 {beats}/min
Peak HR: 153 {beats}/min
Percent HR: 97 %
RPE: 17
Rest HR: 75 {beats}/min

## 2020-07-05 ENCOUNTER — Ambulatory Visit
Admission: RE | Admit: 2020-07-05 | Discharge: 2020-07-05 | Disposition: A | Discharge status: Home / Self Care | Payer: BC Managed Care – PPO | Source: Ambulatory Visit | Attending: Pulmonary Disease | Admitting: Pulmonary Disease

## 2020-07-05 DIAGNOSIS — R000 Tachycardia, unspecified: Secondary | ICD-10-CM | POA: Diagnosis not present

## 2020-07-05 DIAGNOSIS — I700 Atherosclerosis of aorta: Secondary | ICD-10-CM | POA: Diagnosis not present

## 2020-07-05 DIAGNOSIS — R0602 Shortness of breath: Secondary | ICD-10-CM | POA: Diagnosis not present

## 2020-07-10 DIAGNOSIS — N6320 Unspecified lump in the left breast, unspecified quadrant: Secondary | ICD-10-CM | POA: Diagnosis not present

## 2020-07-11 ENCOUNTER — Other Ambulatory Visit: Payer: Self-pay

## 2020-07-11 ENCOUNTER — Ambulatory Visit (INDEPENDENT_AMBULATORY_CARE_PROVIDER_SITE_OTHER): Payer: BC Managed Care – PPO | Admitting: Pulmonary Disease

## 2020-07-11 DIAGNOSIS — R0602 Shortness of breath: Secondary | ICD-10-CM | POA: Diagnosis not present

## 2020-07-11 NOTE — Progress Notes (Signed)
PFT done today. 

## 2020-07-12 ENCOUNTER — Other Ambulatory Visit: Payer: Self-pay | Admitting: Family Medicine

## 2020-07-12 ENCOUNTER — Ambulatory Visit (INDEPENDENT_AMBULATORY_CARE_PROVIDER_SITE_OTHER): Payer: BC Managed Care – PPO | Admitting: Pulmonary Disease

## 2020-07-12 ENCOUNTER — Encounter: Payer: Self-pay | Admitting: Pulmonary Disease

## 2020-07-12 VITALS — BP 126/64 | HR 75 | Temp 97.4°F | Ht 62.0 in | Wt 137.0 lb

## 2020-07-12 DIAGNOSIS — R0602 Shortness of breath: Secondary | ICD-10-CM | POA: Diagnosis not present

## 2020-07-12 DIAGNOSIS — Z23 Encounter for immunization: Secondary | ICD-10-CM

## 2020-07-12 DIAGNOSIS — R911 Solitary pulmonary nodule: Secondary | ICD-10-CM | POA: Diagnosis not present

## 2020-07-12 DIAGNOSIS — N63 Unspecified lump in unspecified breast: Secondary | ICD-10-CM

## 2020-07-12 LAB — PULMONARY FUNCTION TEST
DL/VA % pred: 123 %
DL/VA: 5.27 ml/min/mmHg/L
DLCO cor % pred: 136 %
DLCO cor: 25.41 ml/min/mmHg
DLCO unc % pred: 136 %
DLCO unc: 25.41 ml/min/mmHg
FEF 25-75 Post: 3.83 L/sec
FEF 25-75 Pre: 3.11 L/sec
FEF2575-%Change-Post: 23 %
FEF2575-%Pred-Post: 181 %
FEF2575-%Pred-Pre: 147 %
FEV1-%Change-Post: 4 %
FEV1-%Pred-Post: 111 %
FEV1-%Pred-Pre: 106 %
FEV1-Post: 2.54 L
FEV1-Pre: 2.44 L
FEV1FVC-%Change-Post: 5 %
FEV1FVC-%Pred-Pre: 110 %
FEV6-%Change-Post: -1 %
FEV6-%Pred-Post: 98 %
FEV6-%Pred-Pre: 99 %
FEV6-Post: 2.82 L
FEV6-Pre: 2.85 L
FEV6FVC-%Change-Post: 0 %
FEV6FVC-%Pred-Post: 103 %
FEV6FVC-%Pred-Pre: 103 %
FVC-%Change-Post: 0 %
FVC-%Pred-Post: 94 %
FVC-%Pred-Pre: 95 %
FVC-Post: 2.83 L
FVC-Pre: 2.85 L
Post FEV1/FVC ratio: 90 %
Post FEV6/FVC ratio: 100 %
Pre FEV1/FVC ratio: 85 %
Pre FEV6/FVC Ratio: 100 %
RV % pred: 103 %
RV: 2.01 L
TLC % pred: 102 %
TLC: 4.85 L

## 2020-07-12 NOTE — Progress Notes (Signed)
[name]    [id number]    07-Jan-1956  Primary Care Physician:Kim, Fayrene Fearing, MD  Referring Physician: Pearson Grippe, MD [address] [place],  [place] [address]  Chief complaint: Follow-up for dyspnea  HPI: 64 year old with history of hypertension, SVT next Diagnosed with COVID-19 in August 2020.  She did not require hospitalization.  Complains of ongoing dyspnea since then.  She has symptoms with exertion and sometimes at rest especially at night. She had a chest x-ray at her primary care immediately after her Covid infection which was read as mild interstitial abnormality.  Follow-up chest x-ray was reportedly normal.  I do not have these images to review.  Evaluated by cardiology for SVT in early 2021.  Underwent ablation in June 2021 and is currently on flecainide.  Pets: Has a dog, she has a [place] since early 2010 Occupation: Hairdresser Exposures: Exposed to Conservator, museum/gallery.  No mold, hot tub, Jacuzzi.  No feather pillows or comforters Smoking history: Never smoker Travel history: No significant travel history Relevant family history: Father had lung cancer.  He was a smoker.  Interim history: Continues to have mild dyspnea on exertion.  Denies any cough, fevers, chills or wheezing. Here for review of CT scan and PFTs  Outpatient Encounter Medications as of 07/12/2020  Medication Sig  . aspirin 81 MG tablet Take 81 mg by mouth daily.  . Biotin 5000 MCG TABS Take 10,000 mcg by mouth daily.   . Cyanocobalamin (VITAMIN B 12 PO) Take 1,000 mcg by mouth daily.   . flecainide (TAMBOCOR) 50 MG tablet Take 1 tablet (50 mg total) by mouth 2 (two) times daily. Take as directed  . folic acid (FOLVITE) 400 MCG tablet Take 400 mcg by mouth 2 (two) times a day.   . Magnesium 250 MG TABS Take 250 mg by mouth daily.  . Multiple Vitamin (MULTIVITAMIN) tablet Take 1 tablet by mouth daily. Gummy  . Vitamin D, Cholecalciferol, 25 MCG (1000 UT) TABS Take 1,000  Units by mouth daily.    No facility-administered encounter medications on file as of 07/12/2020.   Physical Exam: Blood pressure 138/82, pulse 84, temperature (!) 97 F (36.1 C), temperature source Skin, height 5\' 1"  (1.549 m), weight 139 lb 3.2 oz (63.1 kg), SpO2 99 %. Gen:      No acute distress HEENT:  EOMI, sclera anicteric Neck:     No masses; no thyromegaly Lungs:    Clear to auscultation bilaterally; normal respiratory effort CV:         Regular rate and rhythm; no murmurs Abd:      + bowel sounds; soft, non-tender; no palpable masses, no distension Ext:    No edema; adequate peripheral perfusion Skin:      Warm and dry; no rash Neuro: alert and oriented x 3 Psych: normal mood and affect  Data Reviewed: Imaging: High-resolution CT 07/05/2020-no interstitial lung disease, 3 mm pulmonary nodules, aortic atherosclerosis.  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 History notable for ongoing exposure to birds. However CT scan with no interstitial lung disease, PFTs are normal She likely has element of deconditioning after her Covid infection with Covid long-haul syndrome  Advised her to start an exercise program  Lung nodules Noted on CT scan Likely benign in a non-smoker.  Reassured the patient but she is anxious Follow-up CT without contrast in 1 year.  Plan/Recommendations: Exercise program Follow-up CT in 1 year.  Chilton Greathouse MD [facility] 07/12/2020, 8:30 AM  CC: Pearson Grippe, MD

## 2020-07-12 NOTE — Addendum Note (Signed)
Addended by: Jacquiline Doe on: 07/12/2020 09:23 AM   Modules accepted: Orders

## 2020-07-12 NOTE — Patient Instructions (Signed)
I have reviewed your CT scan and PFTs which show normal lung function The tiny lung nodules that are likely benign  We will order a follow-up CT without contrast in 1 year for lung nodule follow-up Follow-up in clinic after 1 year.

## 2020-07-27 ENCOUNTER — Ambulatory Visit
Admission: RE | Admit: 2020-07-27 | Discharge: 2020-07-27 | Disposition: A | Discharge status: Home / Self Care | Payer: BC Managed Care – PPO | Source: Ambulatory Visit | Attending: Family Medicine | Admitting: Family Medicine

## 2020-07-27 ENCOUNTER — Other Ambulatory Visit: Payer: Self-pay

## 2020-07-27 DIAGNOSIS — N63 Unspecified lump in unspecified breast: Secondary | ICD-10-CM

## 2020-07-27 DIAGNOSIS — N6489 Other specified disorders of breast: Secondary | ICD-10-CM | POA: Diagnosis not present

## 2020-07-27 DIAGNOSIS — R922 Inconclusive mammogram: Secondary | ICD-10-CM | POA: Diagnosis not present

## 2020-09-10 ENCOUNTER — Other Ambulatory Visit: Payer: Self-pay | Admitting: Internal Medicine

## 2020-09-29 ENCOUNTER — Telehealth: Payer: Self-pay | Admitting: Pulmonary Disease

## 2020-09-29 NOTE — Telephone Encounter (Signed)
I called and spoke with pt and she is aware that PM is only doing pulmonary issues and is not a PCP provider.  Pt voiced her understanding and nothing further is needed.

## 2020-10-17 DIAGNOSIS — H524 Presbyopia: Secondary | ICD-10-CM | POA: Diagnosis not present

## 2020-10-17 DIAGNOSIS — H52203 Unspecified astigmatism, bilateral: Secondary | ICD-10-CM | POA: Diagnosis not present

## 2020-10-17 DIAGNOSIS — H25013 Cortical age-related cataract, bilateral: Secondary | ICD-10-CM | POA: Diagnosis not present

## 2020-11-24 ENCOUNTER — Other Ambulatory Visit: Payer: Self-pay

## 2020-11-24 ENCOUNTER — Encounter: Payer: Self-pay | Admitting: Cardiology

## 2020-11-24 ENCOUNTER — Ambulatory Visit (INDEPENDENT_AMBULATORY_CARE_PROVIDER_SITE_OTHER): Payer: BC Managed Care – PPO | Admitting: Cardiology

## 2020-11-24 VITALS — BP 130/80 | HR 65 | Ht 62.0 in | Wt 138.0 lb

## 2020-11-24 DIAGNOSIS — Z79899 Other long term (current) drug therapy: Secondary | ICD-10-CM | POA: Diagnosis not present

## 2020-11-24 DIAGNOSIS — E7800 Pure hypercholesterolemia, unspecified: Secondary | ICD-10-CM

## 2020-11-24 DIAGNOSIS — I700 Atherosclerosis of aorta: Secondary | ICD-10-CM | POA: Diagnosis not present

## 2020-11-24 DIAGNOSIS — Z8249 Family history of ischemic heart disease and other diseases of the circulatory system: Secondary | ICD-10-CM

## 2020-11-24 DIAGNOSIS — I471 Supraventricular tachycardia: Secondary | ICD-10-CM | POA: Diagnosis not present

## 2020-11-24 LAB — LIPID PANEL
Chol/HDL Ratio: 3.6 ratio (ref 0.0–4.4)
Cholesterol, Total: 280 mg/dL — ABNORMAL HIGH (ref 100–199)
HDL: 78 mg/dL (ref >39)
LDL Chol Calc (NIH): 188 mg/dL — ABNORMAL HIGH (ref 0–99)
Triglycerides: 86 mg/dL (ref 0–149)
VLDL Cholesterol Cal: 14 mg/dL (ref 5–40)

## 2020-11-24 LAB — ALT: ALT: 22 IU/L (ref 0–32)

## 2020-11-24 MED ORDER — ROSUVASTATIN CALCIUM 5 MG PO TABS
5.0000 mg | ORAL_TABLET | ORAL | 3 refills | Status: DC
Start: 2020-11-24 — End: 2021-01-08

## 2020-11-24 NOTE — Patient Instructions (Addendum)
Medication Instructions:  Please start Crestor 5 mg 1 tablet every other day.  Continue all other medications as listed.  *If you need a refill on your cardiac medications before your next appointment, please call your pharmacy*  Lab Work: Please have blood work today and again in 3 months. (Lipid/ALT)  If you have labs (blood work) drawn today and your tests are completely normal, you will receive your results only by: Marland Kitchen MyChart Message (if you have MyChart) OR . A paper copy in the mail If you have any lab test that is abnormal or we need to change your treatment, we will call you to review the results.  Follow-Up: At [facility], you and your health needs are our priority.  As part of our continuing mission to provide you with exceptional heart care, we have created designated Provider Care Teams.  These Care Teams include your primary Cardiologist (physician) and Advanced Practice Providers (APPs -  Physician Assistants and Nurse Practitioners) who all work together to provide you with the care you need, when you need it.  We recommend signing up for the patient portal called "MyChart".  Sign up information is provided on this After Visit Summary.  MyChart is used to connect with patients for Virtual Visits (Telemedicine).  Patients are able to view lab/test results, encounter notes, upcoming appointments, etc.  Non-urgent messages can be sent to your provider as well.   To learn more about what you can do with MyChart, go to [email or web address].    Your next appointment:   6 month(s)  The format for your next appointment:   In Person  Provider:   Donato Schultz, MD   Thank you for choosing [facility]!!

## 2020-11-24 NOTE — Progress Notes (Signed)
Cardiology Office Note:    Date:  11/24/2020   ID:  [name], DOB October 31, 1955, MRN [id number]  PCP:  Melida Quitter, MD   [facility]  Cardiologist:  Donato Schultz, MD  Advanced Practice Provider:  Rosalio Macadamia, NP Electrophysiologist:  None       Referring MD: Pearson Grippe, MD     History of Present Illness:    [name] is a 65 y.o. female here for follow-up of atrial tachycardia.  On flecainide, Dr. Ladona Ridgel.  Dr. Ladona Ridgel suspicion was that both the narrow and wide complex tachycardia PVCs seem more likely both SVT with and without aberrant conduction.  Since starting low-dose flecainide they have resolved.  Doing well.  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  Past Medical History:  Diagnosis Date  . Anxiety   . Depression   . History of CT scan    Coronary calcium CT 3/19: Calcium score 0  . HTN (hypertension) 05/21/2011  . Hypercholesterolemia 04/09/2010   Qualifier: Diagnosis of  By: Manson Passey CMA, Tresa Endo    . Hyperlipidemia   . Menopause 03/11/2012  . Palpitations 07/08/2017  . Polyp of colon 2009   benign  . PVC's (premature ventricular contractions)    a. prior event monitor 02/2014: NSR, rare PVC.  Marland Kitchen SAB (spontaneous abortion)   . Stress    situational  . SVT (supraventricular tachycardia) (HCC) 01/11/2020  . Tiredness 03/11/2012  . Trisomy 18    prior pregnancy with Trisomy 79    Past Surgical History:  Procedure Laterality Date  . CARPAL TUNNEL RELEASE Right 2004  . CESAREAN SECTION     x 2 ( 1 with trisomy 18)  . ELECTROPHYSIOLOGY STUDY N/A 02/25/2020   Procedure: ELECTROPHYSIOLOGY STUDY;  Surgeon: Marinus Maw, MD;  Location: [facility] INVASIVE CV LAB;  Service: Cardiovascular;  Laterality: N/A;  . MYOMECTOMY    . SVT ABLATION N/A 02/25/2020   Procedure: SVT ABLATION;  Surgeon: Marinus Maw, MD;  [facility] INVAS[facility] CV LAB;  Service: Cardiovascular;  Laterality: N/A;  . TOTAL ABDOMINAL  HYSTERECTOMY W/ BILATERAL SALPINGOOPHORECTOMY  03/04/2006    Current Medications: Current Meds  Medication Sig  . aspirin 81 MG tablet Take 81 mg by mouth daily.  . Biotin 5000 MCG TABS Take 10,000 mcg by mouth daily.   . Cyanocobalamin (VITAMIN B 12 PO) Take 1,000 mcg by mouth daily.   . flecainide (TAMBOCOR) 50 MG tablet TAKE 1 TABLET(50 MG) BY MOUTH TWICE DAILY AS DIRECTED  . folic acid (FOLVITE) 400 MCG tablet Take 400 mcg by mouth 2 (two) times a day.   . Magnesium 250 MG TABS Take 250 mg by mouth daily.  . Multiple Vitamin (MULTIVITAMIN) tablet Take 1 tablet by mouth daily. Gummy  . rosuvastatin (CRESTOR) 5 MG tablet Take 1 tablet (5 mg total) by mouth every other day.  . Vitamin D, Cholecalciferol, 25 MCG (1000 UT) TABS Take 1,000 Units by mouth daily.      Allergies:   Amoxicillin, Codeine, Morphine and related, and Megace [megestrol]   Social History   Socioeconomic History  . Marital status: Widowed    Spouse name: Not on file  . Number of children: Not on file  . Years of education: Not on file  . Highest education level: Not on file  Occupational History  . Not on file  Tobacco Use  . Smoking status: Never Smoker  . Smokeless  tobacco: Never Used  Vaping Use  . Vaping Use: Never used  Substance and Sexual Activity  . Alcohol use: No  . Drug use: No  . Sexual activity: Never  Other Topics Concern  . Not on file  Social History Narrative  . Not on file   Social Determinants of Health   Financial Resource Strain: Not on file  Food Insecurity: Not on file  Transportation Needs: Not on file  Physical Activity: Not on file  Stress: Not on file  Social Connections: Not on file     Family History: The patient's family history includes Atrial fibrillation in her father; Heart attack (age of onset: 31) in her mother; Heart failure in her father; Hypertension in her father and mother; Lung cancer in her father. There is no history of Stroke or Breast  cancer.  ROS:   Please see the history of present illness.     All other systems reviewed and are negative.  EKGs/Labs/Other Studies Reviewed:    The following studies were reviewed today:  Personally reviewed lung cancer screening CT-aortic atherosclerosis noted.  Prior coronary calcium score in 2019 was 0.  EKG:  EKG is  ordered today.  The ekg ordered today demonstrates sinus rhythm 65 bpm normal QRS duration 98 ms  Recent Labs: 12/07/2019: ALT 16; Magnesium 2.4; TSH 2.760 02/22/2020: BUN 20; Creatinine, Ser 0.70; Hemoglobin 14.8; Platelets 267; Potassium 4.4; Sodium 139  Recent Lipid Panel    Component Value Date/Time   CHOL 226 (H) 07/09/2017 1027   TRIG 91 07/09/2017 1027   HDL 67 07/09/2017 1027   CHOLHDL 3.4 07/09/2017 1027   CHOLHDL 4 06/21/2013 0924   VLDL 26.0 06/21/2013 0924   LDLCALC 141 (H) 07/09/2017 1027   LDLDIRECT 160.4 06/21/2013 0924     Risk Assessment/Calculations:      Physical Exam:    VS:  BP 130/80 (BP Location: Left Arm, Patient Position: Sitting, Cuff Size: Normal)   Pulse 65   Ht 5\' 2"  (1.575 m)   Wt 138 lb (62.6 kg)   SpO2 95%   BMI 25.24 kg/m     Wt Readings from Last 3 Encounters:  11/24/20 138 lb (62.6 kg)  07/12/20 137 lb (62.1 kg)  06/09/20 139 lb 3.2 oz (63.1 kg)     GEN:  Well nourished, well developed in no acute distress HEENT: Normal NECK: No JVD; No carotid bruits LYMPHATICS: No lymphadenopathy CARDIAC: RRR, no murmurs, rubs, gallops RESPIRATORY:  Clear to auscultation without rales, wheezing or rhonchi  ABDOMEN: Soft, non-tender, non-distended MUSCULOSKELETAL:  No edema; No deformity  SKIN: Warm and dry NEUROLOGIC:  Alert and oriented x 3 PSYCHIATRIC:  Normal affect   ASSESSMENT:    1. Aortic atherosclerosis (HCC)   2. Family history of early CAD   3. SVT (supraventricular tachycardia) (HCC)   4. Medication management   5. Hypercholesterolemia    PLAN:    In order of problems listed above:  Aortic  atherosclerosis -Showed her pictures of her CT scans. -Willing to try Crestor 5 mg every other day. -We will check lipid panel today with ALT and then repeat in 3 months with ALT after taking low-dose Crestor. -Thankfully, coronary arteries do not have any calcification currently.  SVT -On flecainide doing well.  EKG excellent.  QRS 98 ms.  6 mth f/u  Medication Adjustments/Labs and Tests Ordered: Current medicines are reviewed at length with the patient today.  Concerns regarding medicines are outlined above.  Orders Placed This Encounter  Procedures  . ALT  . Lipid panel  . ALT  . Lipid panel  . EKG 12-Lead   Meds ordered this encounter  Medications  . rosuvastatin (CRESTOR) 5 MG tablet    Sig: Take 1 tablet (5 mg total) by mouth every other day.    Dispense:  45 tablet    Refill:  3    Patient Instructions  Medication Instructions:  Please start Crestor 5 mg 1 tablet every other day.  Continue all other medications as listed.  *If you need a refill on your cardiac medications before your next appointment, please call your pharmacy*  Lab Work: Please have blood work today and again in 3 months. (Lipid/ALT)  If you have labs (blood work) drawn today and your tests are completely normal, you will receive your results only by: Marland Kitchen MyChart Message (if you have MyChart) OR . A paper copy in the mail If you have any lab test that is abnormal or we need to change your treatment, we will call you to review the results.  Follow-Up: At [facility], you and your health needs are our priority.  As part of our continuing mission to provide you with exceptional heart care, we have created designated Provider Care Teams.  These Care Teams include your primary Cardiologist (physician) and Advanced Practice Providers (APPs -  Physician Assistants and Nurse Practitioners) who all work together to provide you with the care you need, when you need it.  We recommend signing up for the  patient portal called "MyChart".  Sign up information is provided on this After Visit Summary.  MyChart is used to connect with patients for Virtual Visits (Telemedicine).  Patients are able to view lab/test results, encounter notes, upcoming appointments, etc.  Non-urgent messages can be sent to your provider as well.   To learn more about what you can do with MyChart, go to [email or web address].    Your next appointment:   6 month(s)  The format for your next appointment:   In Person  Provider:   Donato Schultz, MD   Thank you for choosing [facility]!!        Signed, Donato Schultz, MD  11/24/2020 10:20 AM    [facility]

## 2020-11-29 DIAGNOSIS — R768 Other specified abnormal immunological findings in serum: Secondary | ICD-10-CM | POA: Diagnosis not present

## 2020-11-29 DIAGNOSIS — I471 Supraventricular tachycardia: Secondary | ICD-10-CM | POA: Diagnosis not present

## 2020-11-29 DIAGNOSIS — Z23 Encounter for immunization: Secondary | ICD-10-CM | POA: Diagnosis not present

## 2020-11-29 DIAGNOSIS — Z1331 Encounter for screening for depression: Secondary | ICD-10-CM | POA: Diagnosis not present

## 2020-11-30 ENCOUNTER — Other Ambulatory Visit: Payer: Self-pay | Admitting: Internal Medicine

## 2020-11-30 DIAGNOSIS — Z13820 Encounter for screening for osteoporosis: Secondary | ICD-10-CM

## 2020-12-05 DIAGNOSIS — D225 Melanocytic nevi of trunk: Secondary | ICD-10-CM | POA: Diagnosis not present

## 2020-12-05 DIAGNOSIS — D2239 Melanocytic nevi of other parts of face: Secondary | ICD-10-CM | POA: Diagnosis not present

## 2020-12-05 DIAGNOSIS — L719 Rosacea, unspecified: Secondary | ICD-10-CM | POA: Diagnosis not present

## 2020-12-05 DIAGNOSIS — L821 Other seborrheic keratosis: Secondary | ICD-10-CM | POA: Diagnosis not present

## 2021-01-08 ENCOUNTER — Other Ambulatory Visit: Payer: Self-pay | Admitting: *Deleted

## 2021-01-08 MED ORDER — ROSUVASTATIN CALCIUM 5 MG PO TABS: 5.0000 mg | ORAL_TABLET | Freq: Every day | ORAL | 3 refills | Status: DC

## 2021-01-11 DIAGNOSIS — H00014 Hordeolum externum left upper eyelid: Secondary | ICD-10-CM | POA: Diagnosis not present

## 2021-03-08 ENCOUNTER — Other Ambulatory Visit: Payer: BC Managed Care – PPO

## 2021-03-08 ENCOUNTER — Other Ambulatory Visit: Payer: Self-pay

## 2021-03-08 DIAGNOSIS — Z79899 Other long term (current) drug therapy: Secondary | ICD-10-CM | POA: Diagnosis not present

## 2021-03-08 DIAGNOSIS — I700 Atherosclerosis of aorta: Secondary | ICD-10-CM

## 2021-03-08 DIAGNOSIS — E7800 Pure hypercholesterolemia, unspecified: Secondary | ICD-10-CM | POA: Diagnosis not present

## 2021-03-08 LAB — LIPID PANEL
Chol/HDL Ratio: 2.8 ratio (ref 0.0–4.4)
Cholesterol, Total: 197 mg/dL (ref 100–199)
HDL: 71 mg/dL (ref >39)
LDL Chol Calc (NIH): 112 mg/dL — ABNORMAL HIGH (ref 0–99)
Triglycerides: 80 mg/dL (ref 0–149)
VLDL Cholesterol Cal: 14 mg/dL (ref 5–40)

## 2021-03-08 LAB — ALT: ALT: 31 IU/L (ref 0–32)

## 2021-04-11 ENCOUNTER — Other Ambulatory Visit: Payer: Self-pay | Admitting: Internal Medicine

## 2021-05-28 ENCOUNTER — Ambulatory Visit: Payer: BC Managed Care – PPO | Admitting: Cardiology

## 2021-05-28 DIAGNOSIS — Z1211 Encounter for screening for malignant neoplasm of colon: Secondary | ICD-10-CM | POA: Diagnosis not present

## 2021-05-28 LAB — COLOGUARD: Cologuard: NEGATIVE *Deleted

## 2021-05-28 LAB — HM COLONOSCOPY

## 2021-06-13 ENCOUNTER — Other Ambulatory Visit: Payer: BC Managed Care – PPO

## 2021-06-19 ENCOUNTER — Ambulatory Visit
Admission: RE | Admit: 2021-06-19 | Discharge: 2021-06-19 | Disposition: A | Discharge status: Home / Self Care | Payer: BC Managed Care – PPO | Source: Ambulatory Visit | Attending: Pulmonary Disease | Admitting: Pulmonary Disease

## 2021-06-19 DIAGNOSIS — I700 Atherosclerosis of aorta: Secondary | ICD-10-CM | POA: Diagnosis not present

## 2021-06-19 DIAGNOSIS — R911 Solitary pulmonary nodule: Secondary | ICD-10-CM

## 2021-06-19 DIAGNOSIS — R918 Other nonspecific abnormal finding of lung field: Secondary | ICD-10-CM | POA: Diagnosis not present

## 2021-09-19 ENCOUNTER — Other Ambulatory Visit: Payer: Self-pay

## 2021-09-19 ENCOUNTER — Ambulatory Visit (INDEPENDENT_AMBULATORY_CARE_PROVIDER_SITE_OTHER): Payer: BC Managed Care – PPO | Admitting: Cardiology

## 2021-09-19 ENCOUNTER — Encounter: Payer: Self-pay | Admitting: Cardiology

## 2021-09-19 VITALS — BP 140/80 | HR 69 | Ht 62.0 in | Wt 141.0 lb

## 2021-09-19 DIAGNOSIS — I471 Supraventricular tachycardia: Secondary | ICD-10-CM

## 2021-09-19 DIAGNOSIS — I700 Atherosclerosis of aorta: Secondary | ICD-10-CM

## 2021-09-19 DIAGNOSIS — E7800 Pure hypercholesterolemia, unspecified: Secondary | ICD-10-CM

## 2021-09-19 HISTORY — DX: Atherosclerosis of aorta: I70.0

## 2021-09-19 NOTE — Assessment & Plan Note (Signed)
At last visit willing to try Crestor 5 mg low-dose every other day.

## 2021-09-19 NOTE — Progress Notes (Signed)
Cardiology Office Note:    Date:  09/19/2021   ID:  [name], DOB 1955-11-12, MRN [id number]  PCP:  Michael Boston, MD   [place]  Cardiologist:  Candee Furbish, MD  Advanced Practice Provider:  Burtis Junes, NP (Inactive) Electrophysiologist:  None       Referring MD: Michael Boston, MD     History of Present Illness:    [name] is a 66 y.o. female here for follow-up of SVT/atrial tachycardia up to 200 bpm.  On flecainide, Dr. Lovena Le.  Dr. Lovena Le suspicion was that both the narrow and wide complex tachycardia PVCs seem more likely both SVT with and without aberrant conduction.  Had an EP study in June 2021 but no ablation.  Since starting low-dose flecainide they have resolved.  Doing well.  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  Tried cardiac ablation on 02/25/2020 but was unsuccessful because he found multiple areas that could be causing palpations.  Today, she reports she has been doing well and since starting 50 mg flecainide, her heart rate has reduced. She reports she has been feeling calmer. Prefers flecainide to metoprolol.  Started 5 mg crestor and LDL has decreased from 188 to 112. Doing well and has no complaints.   She has quit caffeine and is managing a healthy diet.  She denies chest pain, shortness of breath, palpitations, lightheadedness, headaches, syncope, LE edema, orthopnea, PND.   Past Medical History:  Diagnosis Date   Anxiety    Aortic atherosclerosis ([place]) 09/19/2021   Coronary calcium score-0 in 2019, mild aortic atherosclerosis noted   Depression    History of CT scan    Coronary calcium CT 3/19: Calcium score 0   HTN (hypertension) 05/21/2011   Hypercholesterolemia 04/09/2010   Qualifier: Diagnosis of  By: Owens Shark CMA, [place]     Hyperlipidemia    Menopause 03/11/2012   Palpitations 07/08/2017   Polyp of colon 2009   benign   PVC's (premature ventricular  contractions)    a. prior event monitor 02/2014: NSR, rare PVC.   SAB (spontaneous abortion)    Stress    situational   SVT (supraventricular tachycardia) (HCC) 01/11/2020   SVT (supraventricular tachycardia) (HCC) 01/11/2020   Atrial tachycardia/SVT/average 200 bpm on monitor 12/2019-flecanide, Dr. Lovena Le   Tiredness 03/11/2012   Trisomy 18    prior pregnancy with Trisomy 18    Past Surgical History:  Procedure Laterality Date   CARPAL TUNNEL RELEASE Right 2004   CESAREAN SECTION     x 2 ( 1 with trisomy 18)   ELECTROPHYSIOLOGY STUDY N/A 02/25/2020   Procedure: ELECTROPHYSIOLOGY STUDY;  Surgeon: Evans Lance, MD;  Location: [place] CV LAB;  Service: Cardiovascular;  Laterality: N/A;   MYOMECTOMY     SVT ABLATION N/A 02/25/2020   Procedure: SVT ABLATION;  Surgeon: Evans Lance, MD;  Location: [place] CV LAB;  Service: Cardiovascular;  Laterality: N/A;   TOTAL ABDOMINAL HYSTERECTOMY W/ BILATERAL SALPINGOOPHORECTOMY  03/04/2006    Current Medications: Current Meds  Medication Sig   aspirin 81 MG tablet Take 81 mg by mouth daily.   Biotin 5000 MCG TABS Take 10,000 mcg by mouth daily.    Cyanocobalamin (VITAMIN B 12 PO) Take 1,000 mcg by mouth daily.    flecainide (TAMBOCOR) 50 MG tablet TAKE 1 TABLET(50 MG) BY MOUTH TWICE DAILY AS DIRECTED   folic acid (FOLVITE) A999333 MCG tablet Take  400 mcg by mouth 2 (two) times a day.    Magnesium 250 MG TABS Take 250 mg by mouth daily.   Multiple Vitamin (MULTIVITAMIN) tablet Take 1 tablet by mouth daily. Gummy   rosuvastatin (CRESTOR) 5 MG tablet Take 1 tablet (5 mg total) by mouth daily.   Vitamin D, Cholecalciferol, 25 MCG (1000 UT) TABS Take 1,000 Units by mouth daily.      Allergies:   Amoxicillin, Codeine, Morphine and related, and Megace [megestrol]   Social History   Socioeconomic History   Marital status: Widowed    Spouse name: Not on file   Number of children: Not on file   Years of education: Not on file   Highest  education level: Not on file  Occupational History   Not on file  Tobacco Use   Smoking status: Never   Smokeless tobacco: Never  Vaping Use   Vaping Use: Never used  Substance and Sexual Activity   Alcohol use: No   Drug use: No   Sexual activity: Never  Other Topics Concern   Not on file  Social History Narrative   Not on file   Social Determinants of Health   Financial Resource Strain: Not on file  Food Insecurity: Not on file  Transportation Needs: Not on file  Physical Activity: Not on file  Stress: Not on file  Social Connections: Not on file     Family History: The patient's family history includes Atrial fibrillation in her father; Heart attack (age of onset: 2) in her mother; Heart failure in her father; Hypertension in her father and mother; Lung cancer in her father. There is no history of Stroke or Breast cancer.  ROS:   Please see the history of present illness.     All other systems reviewed and are negative.  EKGs/Labs/Other Studies Reviewed:    The following studies were reviewed today:  Personally reviewed lung cancer screening CT-aortic atherosclerosis noted.  Prior coronary calcium score in 2019 was 0.  [place] 01/05/20:    EKG:    09/19/2021: sinus rhythm  rate- 69 bpm QRS- 98 ms  11/24/2020: The ekg ordered today demonstrates sinus rhythm 65 bpm normal QRS duration 98 ms  Recent Labs: 03/08/2021: ALT 31  Recent Lipid Panel    Component Value Date/Time   CHOL 197 03/08/2021 0913   TRIG 80 03/08/2021 0913   HDL 71 03/08/2021 0913   CHOLHDL 2.8 03/08/2021 0913   CHOLHDL 4 06/21/2013 0924   VLDL 26.0 06/21/2013 0924   LDLCALC 112 (H) 03/08/2021 0913   LDLDIRECT 160.4 06/21/2013 0924     Risk Assessment/Calculations:      Physical Exam:    VS:  BP 140/80 (BP Location: Left Arm, Patient Position: Sitting, Cuff Size: Normal)    Pulse 69    Ht 5\' 2"  (1.575 m)    Wt 141 lb (64 kg)    SpO2 98%    BMI 25.79 kg/m     Wt Readings from  Last 3 Encounters:  09/19/21 141 lb (64 kg)  11/24/20 138 lb (62.6 kg)  07/12/20 137 lb (62.1 kg)     GEN:  Well nourished, well developed in no acute distress HEENT: Normal NECK: No JVD; No carotid bruits LYMPHATICS: No lymphadenopathy CARDIAC: RRR, no murmurs, rubs, gallops RESPIRATORY:  Clear to auscultation without rales, wheezing or rhonchi  ABDOMEN: Soft, non-tender, non-distended MUSCULOSKELETAL:  No edema; No deformity  SKIN: Warm and dry NEUROLOGIC:  Alert and oriented x 3 PSYCHIATRIC:  Normal affect   ASSESSMENT:    1. SVT (supraventricular tachycardia) ([place])   2. Aortic atherosclerosis (HCC)   3. Pure hypercholesterolemia     PLAN:    In order of problems listed above: SVT (supraventricular tachycardia) (HCC) Stable, on flecainide.  Doing very well on low-dose.  No further palpitations or SVT.  She did have an EP study done in June 2021 by Dr. Lovena Le however there was no inducible arrhythmias at that time.  No ablation took place.  Aortic atherosclerosis ([place]) At last visit willing to try Crestor 5 mg low-dose every other day.  Pure hypercholesterolemia Now on low-dose Crestor 5 mg every other day.  She messaged [place] after about a month of usage and was willing to try Crestor 5 mg daily.  LDL 112 down from 188.  Excellent response.     Follow-up in 6 months with APP and 1 year with me.  Medication Adjustments/Labs and Tests Ordered: Current medicines are reviewed at length with the patient today.  Concerns regarding medicines are outlined above.  Orders Placed This Encounter  Procedures   EKG 12-Lead   No orders of the defined types were placed in this encounter.   Patient Instructions  Medication Instructions:  The current medical regimen is effective;  continue present plan and medications.  *If you need a refill on your cardiac medications before your next appointment, please call your pharmacy*  Follow-Up: At [facility], you and your health  needs are our priority.  As part of our continuing mission to provide you with exceptional heart care, we have created designated Provider Care Teams.  These Care Teams include your primary Cardiologist (physician) and Advanced Practice Providers (APPs -  Physician Assistants and Nurse Practitioners) who all work together to provide you with the care you need, when you need it.  We recommend signing up for the patient portal called "MyChart".  Sign up information is provided on this After Visit Summary.  MyChart is used to connect with patients for Virtual Visits (Telemedicine).  Patients are able to view lab/test results, encounter notes, upcoming appointments, etc.  Non-urgent messages can be sent to your provider as well.   To learn more about what you can do with MyChart, go to [email or web address].    Your next appointment:   6 month(s)  The format for your next appointment:   In Person  Provider:   Cecilie Kicks, NP, Ermalinda Barrios, PA-C, Christen Bame, NP, or Richardson Dopp, PA-C     Then, Candee Furbish, MD will plan to see you again in 1 year(s).    Thank you for choosing [facility]!!       I,Zite Okoli,acting as a scribe for [employer], MD.,have documented all relevant documentation on the behalf of Candee Furbish, MD,as directed by  Candee Furbish, MD while in the presence of Candee Furbish, MD.   I, Candee Furbish, MD, have reviewed all documentation for this visit. The documentation on 09/19/21 for the exam, diagnosis, procedures, and orders are all accurate and complete.   Signed, Candee Furbish, MD  09/19/2021 10:06 AM    [place]

## 2021-09-19 NOTE — Assessment & Plan Note (Addendum)
Now on low-dose Crestor 5 mg every other day.  She messaged [place] after about a month of usage and was willing to try Crestor 5 mg daily.  LDL 112 down from 188.  Excellent response.

## 2021-09-19 NOTE — Patient Instructions (Signed)
Medication Instructions:  °The current medical regimen is effective;  continue present plan and medications. ° °*If you need a refill on your cardiac medications before your next appointment, please call your pharmacy* ° °Follow-Up: °At [facility], you and your health needs are our priority.  As part of our continuing mission to provide you with exceptional heart care, we have created designated Provider Care Teams.  These Care Teams include your primary Cardiologist (physician) and Advanced Practice Providers (APPs -  Physician Assistants and Nurse Practitioners) who all work together to provide you with the care you need, when you need it. ° °We recommend signing up for the patient portal called "MyChart".  Sign up information is provided on this After Visit Summary.  MyChart is used to connect with patients for Virtual Visits (Telemedicine).  Patients are able to view lab/test results, encounter notes, upcoming appointments, etc.  Non-urgent messages can be sent to your provider as well.   °To learn more about what you can do with MyChart, go to [email or web address].   ° °Your next appointment:   °6 month(s) ° °The format for your next appointment:   °In Person ° °Provider:   °Laura Ingold, NP, Michele Lenze, PA-C, Michelle Swinyer, NP, or Scott Weaver, PA-C     Then, Mark Skains, MD will plan to see you again in 1 year(s).  ° °Thank you for choosing [facility]!! ° ° ° °

## 2021-09-19 NOTE — Assessment & Plan Note (Addendum)
Stable, on flecainide.  Doing very well on low-dose.  No further palpitations or SVT.  She did have an EP study done in June 2021 by Dr. Ladona Ridgel however there was no inducible arrhythmias at that time.  No ablation took place.

## 2021-10-18 DIAGNOSIS — H52203 Unspecified astigmatism, bilateral: Secondary | ICD-10-CM | POA: Diagnosis not present

## 2021-10-18 DIAGNOSIS — H04123 Dry eye syndrome of bilateral lacrimal glands: Secondary | ICD-10-CM | POA: Diagnosis not present

## 2021-10-18 DIAGNOSIS — H25013 Cortical age-related cataract, bilateral: Secondary | ICD-10-CM | POA: Diagnosis not present

## 2021-11-04 ENCOUNTER — Other Ambulatory Visit: Payer: Self-pay | Admitting: Internal Medicine

## 2021-11-27 DIAGNOSIS — E785 Hyperlipidemia, unspecified: Secondary | ICD-10-CM | POA: Diagnosis not present

## 2021-12-01 ENCOUNTER — Other Ambulatory Visit: Payer: Self-pay | Admitting: Internal Medicine

## 2021-12-03 ENCOUNTER — Other Ambulatory Visit: Payer: Self-pay | Admitting: *Deleted

## 2021-12-03 MED ORDER — FLECAINIDE ACETATE 50 MG PO TABS: 50.0000 mg | ORAL_TABLET | Freq: Two times a day (BID) | ORAL | 0 refills | Status: DC

## 2021-12-04 ENCOUNTER — Other Ambulatory Visit: Payer: Self-pay | Admitting: Internal Medicine

## 2021-12-04 DIAGNOSIS — Z1331 Encounter for screening for depression: Secondary | ICD-10-CM | POA: Diagnosis not present

## 2021-12-04 DIAGNOSIS — Z13820 Encounter for screening for osteoporosis: Secondary | ICD-10-CM

## 2021-12-04 DIAGNOSIS — Z0000 Encounter for general adult medical examination without abnormal findings: Secondary | ICD-10-CM | POA: Diagnosis not present

## 2021-12-04 DIAGNOSIS — I471 Supraventricular tachycardia: Secondary | ICD-10-CM | POA: Diagnosis not present

## 2021-12-04 DIAGNOSIS — Z1339 Encounter for screening examination for other mental health and behavioral disorders: Secondary | ICD-10-CM | POA: Diagnosis not present

## 2021-12-17 ENCOUNTER — Telehealth: Payer: Self-pay | Admitting: Internal Medicine

## 2021-12-17 MED ORDER — FLECAINIDE ACETATE 50 MG PO TABS: 50.0000 mg | ORAL_TABLET | Freq: Two times a day (BID) | ORAL | 3 refills | Status: DC

## 2021-12-17 NOTE — Telephone Encounter (Signed)
?*  STAT* If patient is at the pharmacy, call can be transferred to refill team. ? ? ?1. Which medications need to be refilled? (please list name of each medication and dose if known)  ? flecainide (TAMBOCOR) 50 MG tablet  ? ? ?2. Which pharmacy/location (including street and city if local pharmacy) is medication to be sent to? [id number] - [place], [place] - [address] ? ?3. Do they need a 30 day or 90 day supply? 30 day ? ?

## 2021-12-17 NOTE — Telephone Encounter (Signed)
Pt's medication was sent to pt's pharmacy as requested. Confirmation received.  °

## 2021-12-31 ENCOUNTER — Other Ambulatory Visit: Payer: Self-pay | Admitting: Family Medicine

## 2021-12-31 ENCOUNTER — Other Ambulatory Visit: Payer: Self-pay | Admitting: Internal Medicine

## 2021-12-31 DIAGNOSIS — Z1231 Encounter for screening mammogram for malignant neoplasm of breast: Secondary | ICD-10-CM

## 2022-01-01 ENCOUNTER — Other Ambulatory Visit: Payer: Self-pay | Admitting: Cardiology

## 2022-01-14 ENCOUNTER — Ambulatory Visit: Payer: BC Managed Care – PPO

## 2022-03-11 ENCOUNTER — Encounter: Payer: Self-pay | Admitting: Physician Assistant

## 2022-03-11 ENCOUNTER — Ambulatory Visit: Payer: Medicare Other | Admitting: Physician Assistant

## 2022-03-11 VITALS — BP 122/64 | HR 76 | Ht 61.5 in | Wt 138.8 lb

## 2022-03-11 DIAGNOSIS — E7800 Pure hypercholesterolemia, unspecified: Secondary | ICD-10-CM

## 2022-03-11 DIAGNOSIS — R0602 Shortness of breath: Secondary | ICD-10-CM | POA: Insufficient documentation

## 2022-03-11 DIAGNOSIS — I471 Supraventricular tachycardia: Secondary | ICD-10-CM

## 2022-03-11 DIAGNOSIS — I700 Atherosclerosis of aorta: Secondary | ICD-10-CM

## 2022-03-11 NOTE — Assessment & Plan Note (Signed)
Overall quiescent on current dose of Flecainide.  Continue Flecainide and f/u with EP as planned.

## 2022-03-11 NOTE — Progress Notes (Addendum)
Cardiology Office Note:    Date:  03/11/2022   ID:  AUNE ADAMI, DOB 1956/07/16, MRN [id number]  PCP:  Melida Quitter, MD  [facility] Providers Cardiologist:  Donato Schultz, MD Cardiology APP:  Rosalio Macadamia, NP (Inactive)     Referring MD: Melida Quitter, MD   Chief Complaint:  F/u for SVT    Patient Profile: Supraventricular Tachycardia/Atrial Tachycardia EPS in 02/2020 >> no ablation due to multiple foci Flecainide Rx Aortic atherosclerosis Hyperlipidemia  Hypertension    Prior CV Studies: ETT 06/27/2020 Exercise for 10 minutes; 13.4 METS; no ischemic changes or QT/QRS prolongation  ZIO monitor 01/05/2020 Paroxysmal SVT with average heart rate 208  Echocardiogram 12/29/2019 EF 55-60, no RWMA, normal RVSF, normal PASP, mild LAE, mild MR, mild AI  CAC scoring 12/02/2017 CAC score 0 Aortic atherosclerosis   History of Present Illness:   [name] is a 66 y.o. female with the above problem list.  She was last seen by Dr. Anne Fu in Jan 2023.   She returns for f/u. She is doing well without recurrent palpitations.  She has not had chest pain, syncope, orthopnea.  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  This resolves with elevation.  She has had a couple of episodes of shortness of breath that awoke her from sleep.  She related it to eating a lot of dried fruit. She has not had it at any other time.  She has not had significant dyspnea on exertion.      Past Medical History:  Diagnosis Date   Anxiety    Aortic atherosclerosis (HCC) 09/19/2021   Coronary calcium score-0 in 2019, mild aortic atherosclerosis noted   Depression    History of CT scan    Coronary calcium CT 3/19: Calcium score 0   HTN (hypertension) 05/21/2011   Hypercholesterolemia 04/09/2010   Qualifier: Diagnosis of  By: Manson Passey CMA, Kelly     Hyperlipidemia    Menopause 03/11/2012   Palpitations 07/08/2017   Polyp of colon 2009   benign   PVC's (premature ventricular  contractions)    a. prior event monitor 02/2014: NSR, rare PVC.   SAB (spontaneous abortion)    Stress    situational   SVT (supraventricular tachycardia) (HCC) 01/11/2020   SVT (supraventricular tachycardia) (HCC) 01/11/2020   Atrial tachycardia/SVT/average 200 bpm on monitor 12/2019-flecanide, Dr. Ladona Ridgel   Tiredness 03/11/2012   Trisomy 18    prior pregnancy with Trisomy 18   Current Medications: Current Meds  Medication Sig   aspirin 81 MG tablet Take 81 mg by mouth every other day.   Biotin 5000 MCG TABS Take 10,000 mcg by mouth daily.    Cyanocobalamin (VITAMIN B 12 PO) Take 1,000 mcg by mouth daily.    flecainide (TAMBOCOR) 50 MG tablet Take 1 tablet (50 mg total) by mouth 2 (two) times daily.   folic acid (FOLVITE) 400 MCG tablet Take 400 mcg by mouth 2 (two) times a day.    Magnesium 250 MG TABS Take 250 mg by mouth daily.   Multiple Vitamin (MULTIVITAMIN) tablet Take 1 tablet by mouth daily. Gummy   rosuvastatin (CRESTOR) 5 MG tablet TAKE 1 TABLET(5 MG) BY MOUTH DAILY   Vitamin D, Cholecalciferol, 25 MCG (1000 UT) TABS Take 1,000 Units by mouth daily.     Allergies:   Amoxicillin, Codeine, Morphine and related, and Megace [megestrol]   Social History   Tobacco Use   Smoking status: Never  Smokeless tobacco: Never  Vaping Use   Vaping Use: Never used  Substance Use Topics   Alcohol use: No   Drug use: No    Family Hx: The patient's family history includes Atrial fibrillation in her father; Heart attack (age of onset: 4) in her mother; Heart failure in her father; Hypertension in her father and mother; Lung cancer in her father. There is no history of Stroke or Breast cancer.  ROS See HPI  EKGs/Labs/Other Test Reviewed:    EKG:  EKG is not ordered today.  The ekg ordered today demonstrates n/a  Recent Labs: No results found for requested labs within last 365 days.   Recent Lipid Panel No results for input(s): "CHOL", "TRIG", "HDL", "VLDL", "LDLCALC", "LDLDIRECT" in  the last 8760 hours.   Risk Assessment/Calculations/Metrics:              Physical Exam:    VS:  BP 122/64   Pulse 76   Ht 5' 1.5" (1.562 m)   Wt 138 lb 12.8 oz (63 kg)   SpO2 98%   BMI 25.80 kg/m     Wt Readings from Last 3 Encounters:  03/11/22 138 lb 12.8 oz (63 kg)  09/19/21 141 lb (64 kg)  11/24/20 138 lb (62.6 kg)    Constitutional:      Appearance: Healthy appearance. Not in distress.  Neck:     Vascular: No JVR. JVD normal.  Pulmonary:     Effort: Pulmonary effort is normal.     Breath sounds: No wheezing. No rales.  Cardiovascular:     Normal rate. Regular rhythm. Normal S1. Normal S2.      Murmurs: There is no murmur.  Edema:    Peripheral edema absent.  Abdominal:     Palpations: Abdomen is soft.  Skin:    General: Skin is warm and dry.  Neurological:     Mental Status: Alert and oriented to person, place and time.          ASSESSMENT & PLAN:   SVT (supraventricular tachycardia) (HCC) Overall quiescent on current dose of Flecainide.  Continue Flecainide and f/u with EP as planned.   Aortic atherosclerosis (HCC) Continue ASA, statin Rx.   Pure hypercholesterolemia Labs from [facility] personally reviewed and interpreted.  11/27/21: TC 271, HDL 72, Trig 60, LDL 187. She notes she was not taking her Crestor regularly at that time.  Continue Crestor 5 mg once daily.  Repeat Fasting Lipids, LFTs in 6 mos.    Shortness of breath Etiology of nocturnal dyspnea not clear.  It sounds like she is describing an allergy to one of the foods she eats when she notices these symptoms. Obtain echocardiogram.  If unremarkable, suggest f/u with PCP to discuss +/- allergy testing.            Dispo:  Return in about 6 months (around 09/11/2022) for Routine Follow Up w/ Dr. Anne Fu.   Medication Adjustments/Labs and Tests Ordered: Current medicines are reviewed at length with the patient today.  Concerns regarding medicines are outlined above.  Tests Ordered: Orders Placed  This Encounter  Procedures   Lipid panel   Hepatic function panel   ECHOCARDIOGRAM COMPLETE   Medication Changes: No orders of the defined types were placed in this encounter.  Signed, Tereso Newcomer, PA-C  03/11/2022 2:05 PM    [facility] [address] [place], [place], [place]  [address] Phone: [phone]; Fax: [phone]

## 2022-03-11 NOTE — Assessment & Plan Note (Addendum)
Labs from [facility] personally reviewed and interpreted.  11/27/21: TC 271, HDL 72, Trig 60, LDL 187. She notes she was not taking her Crestor regularly at that time.  Continue Crestor 5 mg once daily.  Repeat Fasting Lipids, LFTs in 6 mos.

## 2022-03-11 NOTE — Assessment & Plan Note (Signed)
Continue ASA, statin Rx.  

## 2022-03-11 NOTE — Patient Instructions (Signed)
Medication Instructions:  Your physician recommends that you continue on your current medications as directed. Please refer to the Current Medication list given to you today.  *If you need a refill on your cardiac medications before your next appointment, please call your pharmacy*   Lab Work: 1 week before sees Dr. Anne Fu in 6 months: FASTING LIPID & LFT  If you have labs (blood work) drawn today and your tests are completely normal, you will receive your results only by: MyChart Message (if you have MyChart) OR A paper copy in the mail If you have any lab test that is abnormal or we need to change your treatment, we will call you to review the results.   Testing/Procedures: Your physician has requested that you have an echocardiogram. Echocardiography is a painless test that uses sound waves to create images of your heart. It provides your doctor with information about the size and shape of your heart and how well your heart's chambers and valves are working. This procedure takes approximately one hour. There are no restrictions for this procedure.'    Follow-Up: At [facility], you and your health needs are our priority.  As part of our continuing mission to provide you with exceptional heart care, we have created designated Provider Care Teams.  These Care Teams include your primary Cardiologist (physician) and Advanced Practice Providers (APPs -  Physician Assistants and Nurse Practitioners) who all work together to provide you with the care you need, when you need it.  We recommend signing up for the patient portal called "MyChart".  Sign up information is provided on this After Visit Summary.  MyChart is used to connect with patients for Virtual Visits (Telemedicine).  Patients are able to view lab/test results, encounter notes, upcoming appointments, etc.  Non-urgent messages can be sent to your provider as well.   To learn more about what you can do with MyChart, go to  [email or web address].    Your next appointment:   6 month(s)  The format for your next appointment:   In Person  Provider:   Donato Schultz, MD     Other Instructions   Important Information About Sugar

## 2022-03-11 NOTE — Assessment & Plan Note (Signed)
Etiology of nocturnal dyspnea not clear.  It sounds like she is describing an allergy to one of the foods she eats when she notices these symptoms. Obtain echocardiogram.  If unremarkable, suggest f/u with PCP to discuss +/- allergy testing.

## 2022-03-18 ENCOUNTER — Other Ambulatory Visit: Payer: Self-pay | Admitting: Internal Medicine

## 2022-03-18 DIAGNOSIS — Z1231 Encounter for screening mammogram for malignant neoplasm of breast: Secondary | ICD-10-CM

## 2022-03-19 ENCOUNTER — Encounter: Payer: Self-pay | Admitting: Internal Medicine

## 2022-03-19 ENCOUNTER — Ambulatory Visit: Payer: Medicare Other | Admitting: Internal Medicine

## 2022-03-19 VITALS — BP 124/80 | HR 69 | Ht 61.5 in | Wt 140.0 lb

## 2022-03-19 DIAGNOSIS — I471 Supraventricular tachycardia: Secondary | ICD-10-CM

## 2022-03-19 NOTE — Progress Notes (Signed)
HPI [name] returns today for followup of NS AT. [name] is a pleasant 66 yo woman with a h/o palpitations who was found to have SVT and underwent EP study over 2 years ago. She had non-sustained AT but could not be mapped. She was placed on flecainide. She has done fairly well on the flecainide over the past 2 years. She c/o working hard all day but then feeling tired at night and wondering if the medication is causing this. 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  Allergies  Allergen Reactions   Amoxicillin     SKIN BURNING SENSATION   Codeine     nausea   Morphine And Related Nausea And Vomiting   Megace [Megestrol] Rash     Current Outpatient Medications  Medication Sig Dispense Refill   aspirin 81 MG tablet Take 81 mg by mouth every other day.     Biotin 5000 MCG TABS Take 10,000 mcg by mouth daily.      Cyanocobalamin (VITAMIN B 12 PO) Take 1,000 mcg by mouth daily.      flecainide (TAMBOCOR) 50 MG tablet Take 1 tablet (50 mg total) by mouth 2 (two) times daily. 60 tablet 3   folic acid (FOLVITE) 400 MCG tablet Take 400 mcg by mouth 2 (two) times a day.      Magnesium 250 MG TABS Take 250 mg by mouth daily.     Multiple Vitamin (MULTIVITAMIN) tablet Take 1 tablet by mouth daily. Gummy     rosuvastatin (CRESTOR) 5 MG tablet TAKE 1 TABLET(5 MG) BY MOUTH DAILY 90 tablet 1   Vitamin D, Cholecalciferol, 25 MCG (1000 UT) TABS Take 1,000 Units by mouth daily.      No current facility-administered medications for this visit.     Past Medical History:  Diagnosis Date   Anxiety    Aortic atherosclerosis (HCC) 09/19/2021   Coronary calcium score-0 in 2019, mild aortic atherosclerosis noted   Depression    History of CT scan    Coronary calcium CT 3/19: Calcium score 0   HTN (hypertension) 05/21/2011   Hypercholesterolemia 04/09/2010   Qualifier: Diagnosis of  By: Manson Passey CMA, Kelly     Hyperlipidemia    Menopause 03/11/2012   Palpitations 07/08/2017   Polyp of colon 2009    benign   PVC's (premature ventricular contractions)    a. prior event monitor 02/2014: NSR, rare PVC.   SAB (spontaneous abortion)    Stress    situational   SVT (supraventricular tachycardia) (HCC) 01/11/2020   SVT (supraventricular tachycardia) (HCC) 01/11/2020   Atrial tachycardia/SVT/average 200 bpm on monitor 12/2019-flecanide, Dr. Ladona Ridgel   Tiredness 03/11/2012   Trisomy 18    prior pregnancy with Trisomy 18    ROS:   All systems reviewed and negative except as noted in the HPI.   Past Surgical History:  Procedure Laterality Date   CARPAL TUNNEL RELEASE Right 2004   CESAREAN SECTION     x 2 ( 1 with trisomy 18)   ELECTROPHYSIOLOGY STUDY N/A 02/25/2020   Procedure: ELECTROPHYSIOLOGY STUDY;  Surgeon: Marinus Maw, MD;  Location: [facility] INVASIVE CV LAB;  Service: Cardiovascular;  Laterality: N/A;   MYOMECTOMY     SVT ABLATION N/A 02/25/2020   Procedure: SVT ABLATION;  Surgeon: Marinus Maw, MD;  Location: [facility] INVASIVE CV LAB;  Service: Cardiovascular;  Laterality: N/A;   TOTAL ABDOMINAL HYSTERECTOMY W/ BILATERAL SALPINGOOPHORECTOMY  03/04/2006     Family  History  Problem Relation Age of Onset   Hypertension Mother    Heart attack Mother 34   Hypertension Father    Atrial fibrillation Father    Lung cancer Father        LUNG   Heart failure Father    Stroke Neg Hx    Breast cancer Neg Hx      Social History   Socioeconomic History   Marital status: Widowed    Spouse name: Not on file   Number of children: Not on file   Years of education: Not on file   Highest education level: Not on file  Occupational History   Not on file  Tobacco Use   Smoking status: Never   Smokeless tobacco: Never  Vaping Use   Vaping Use: Never used  Substance and Sexual Activity   Alcohol use: No   Drug use: No   Sexual activity: Never  Other Topics Concern   Not on file  Social History Narrative   Not on file   Social Determinants of Health   Financial Resource Strain: Not  on file  Food Insecurity: Not on file  Transportation Needs: Not on file  Physical Activity: Not on file  Stress: Not on file  Social Connections: Not on file  Intimate Partner Violence: Not on file     BP 124/80   Pulse 69   Ht 5' 1.5" (1.562 m)   Wt 140 lb (63.5 kg)   PF 98 L/min   BMI 26.02 kg/m   Physical Exam:  Well appearing NAD HEENT: Unremarkable Neck:  No JVD, no thyromegally Lymphatics:  No adenopathy Back:  No CVA tenderness Lungs:  Clear with no wheezes HEART:  Regular rate rhythm, no murmurs, no rubs, no clicks Abd:  soft, positive bowel sounds, no organomegally, no rebound, no guarding Ext:  2 plus pulses, no edema, no cyanosis, no clubbing Skin:  No rashes no nodules Neuro:  CN II through XII intact, motor grossly intact  EKG - nsr   Assess/Plan:  SVT/AT - she has been well controlled on low dose flecainide. She will continue. We discussed cutting back to once daily. She will let us know though I cautioned her against dose reduction.  Fatigue - I think that this is mostly due to her working too much.   Sharlot Gowda Linda Surber,MD

## 2022-03-19 NOTE — Patient Instructions (Addendum)
Medication Instructions:  ?Your physician recommends that you continue on your current medications as directed. Please refer to the Current Medication list given to you today. ? ?Labwork: ?None ordered. ? ?Testing/Procedures: ?None ordered. ? ?Follow-Up: ?Your physician wants you to follow-up in: one year with Gregg Taylor, MD  ?You will receive a reminder letter in the mail two months in advance. If you don't receive a letter, please call our office to schedule the follow-up appointment. ? ?Any Other Special Instructions Will Be Listed Below (If Applicable). ? ?If you need a refill on your cardiac medications before your next appointment, please call your pharmacy.  ? ?Important Information About Sugar ? ? ? ? ? ? ? ?

## 2022-03-25 ENCOUNTER — Ambulatory Visit (HOSPITAL_COMMUNITY): Payer: Medicare Other | Attending: Internal Medicine

## 2022-03-25 ENCOUNTER — Ambulatory Visit
Admission: RE | Admit: 2022-03-25 | Discharge: 2022-03-25 | Disposition: A | Discharge status: Home / Self Care | Payer: Medicare Other | Source: Ambulatory Visit | Attending: Internal Medicine | Admitting: Internal Medicine

## 2022-03-25 DIAGNOSIS — I700 Atherosclerosis of aorta: Secondary | ICD-10-CM | POA: Diagnosis not present

## 2022-03-25 DIAGNOSIS — E7800 Pure hypercholesterolemia, unspecified: Secondary | ICD-10-CM | POA: Insufficient documentation

## 2022-03-25 DIAGNOSIS — I471 Supraventricular tachycardia: Secondary | ICD-10-CM | POA: Insufficient documentation

## 2022-03-25 DIAGNOSIS — Z1231 Encounter for screening mammogram for malignant neoplasm of breast: Secondary | ICD-10-CM

## 2022-03-25 LAB — ECHOCARDIOGRAM COMPLETE
Area-P 1/2: 3.42 cm2
P 1/2 time: 359 msec
S' Lateral: 3.1 cm

## 2022-03-26 ENCOUNTER — Encounter: Payer: Self-pay | Admitting: Physician Assistant

## 2022-03-26 DIAGNOSIS — I340 Nonrheumatic mitral (valve) insufficiency: Secondary | ICD-10-CM | POA: Insufficient documentation

## 2022-05-23 ENCOUNTER — Ambulatory Visit
Admission: RE | Admit: 2022-05-23 | Discharge: 2022-05-23 | Disposition: A | Discharge status: Home / Self Care | Payer: Medicare Other | Source: Ambulatory Visit | Attending: Internal Medicine | Admitting: Internal Medicine

## 2022-05-23 DIAGNOSIS — Z780 Asymptomatic menopausal state: Secondary | ICD-10-CM | POA: Diagnosis not present

## 2022-05-23 DIAGNOSIS — Z13820 Encounter for screening for osteoporosis: Secondary | ICD-10-CM

## 2022-06-21 ENCOUNTER — Other Ambulatory Visit: Payer: Self-pay | Admitting: Internal Medicine

## 2022-06-21 ENCOUNTER — Other Ambulatory Visit: Payer: Self-pay | Admitting: Cardiology

## 2022-07-15 DIAGNOSIS — Z6825 Body mass index (BMI) 25.0-25.9, adult: Secondary | ICD-10-CM | POA: Diagnosis not present

## 2022-07-15 DIAGNOSIS — R768 Other specified abnormal immunological findings in serum: Secondary | ICD-10-CM | POA: Diagnosis not present

## 2022-07-15 DIAGNOSIS — M2559 Pain in other specified joint: Secondary | ICD-10-CM | POA: Diagnosis not present

## 2022-07-15 DIAGNOSIS — M7989 Other specified soft tissue disorders: Secondary | ICD-10-CM | POA: Diagnosis not present

## 2022-08-06 DIAGNOSIS — R768 Other specified abnormal immunological findings in serum: Secondary | ICD-10-CM | POA: Diagnosis not present

## 2022-08-06 DIAGNOSIS — Z6825 Body mass index (BMI) 25.0-25.9, adult: Secondary | ICD-10-CM | POA: Diagnosis not present

## 2022-08-06 DIAGNOSIS — M1991 Primary osteoarthritis, unspecified site: Secondary | ICD-10-CM | POA: Diagnosis not present

## 2022-08-06 DIAGNOSIS — M791 Myalgia, unspecified site: Secondary | ICD-10-CM | POA: Diagnosis not present

## 2022-10-14 DIAGNOSIS — H5711 Ocular pain, right eye: Secondary | ICD-10-CM | POA: Diagnosis not present

## 2022-10-14 DIAGNOSIS — H2000 Unspecified acute and subacute iridocyclitis: Secondary | ICD-10-CM | POA: Diagnosis not present

## 2022-10-21 DIAGNOSIS — H25013 Cortical age-related cataract, bilateral: Secondary | ICD-10-CM | POA: Diagnosis not present

## 2022-10-21 DIAGNOSIS — H2000 Unspecified acute and subacute iridocyclitis: Secondary | ICD-10-CM | POA: Diagnosis not present

## 2022-11-25 DIAGNOSIS — K080 Exfoliation of teeth due to systemic causes: Secondary | ICD-10-CM | POA: Diagnosis not present

## 2023-01-14 DIAGNOSIS — D239 Other benign neoplasm of skin, unspecified: Secondary | ICD-10-CM | POA: Diagnosis not present

## 2023-01-14 DIAGNOSIS — D2230 Melanocytic nevi of unspecified part of face: Secondary | ICD-10-CM | POA: Diagnosis not present

## 2023-01-14 DIAGNOSIS — L719 Rosacea, unspecified: Secondary | ICD-10-CM | POA: Diagnosis not present

## 2023-01-22 ENCOUNTER — Encounter: Payer: Self-pay | Admitting: Cardiology

## 2023-01-22 ENCOUNTER — Ambulatory Visit: Payer: Medicare Other | Attending: Cardiology | Admitting: Cardiology

## 2023-01-22 VITALS — BP 112/82 | HR 74 | Ht 61.0 in | Wt 138.8 lb

## 2023-01-22 DIAGNOSIS — E7800 Pure hypercholesterolemia, unspecified: Secondary | ICD-10-CM

## 2023-01-22 DIAGNOSIS — Z789 Other specified health status: Secondary | ICD-10-CM

## 2023-01-22 DIAGNOSIS — Z79899 Other long term (current) drug therapy: Secondary | ICD-10-CM | POA: Diagnosis not present

## 2023-01-22 DIAGNOSIS — I700 Atherosclerosis of aorta: Secondary | ICD-10-CM | POA: Diagnosis not present

## 2023-01-22 DIAGNOSIS — I471 Supraventricular tachycardia, unspecified: Secondary | ICD-10-CM

## 2023-01-22 MED ORDER — ROSUVASTATIN CALCIUM 5 MG PO TABS: ORAL_TABLET | ORAL | 3 refills | Status: DC

## 2023-01-22 NOTE — Patient Instructions (Signed)
Medication Instructions:  The current medical regimen is effective;  continue present plan and medications.  *If you need a refill on your cardiac medications before your next appointment, please call your pharmacy*   Lab Work: Please return fasting for Lipid, LPa lab work.  If you have labs (blood work) drawn today and your tests are completely normal, you will receive your results only by: MyChart Message (if you have MyChart) OR A paper copy in the mail If you have any lab test that is abnormal or we need to change your treatment, we will call you to review the results.   Testing/Procedures: Your physician has requested that you have a Coronary Calcium score which is completed by CT. Cardiac computed tomography (CT) is a painless test that uses an x-ray machine to take clear, detailed pictures of your heart. There are no instructions for this testing.  You may eat/drink and take your normal medications this day.  The cost of the testing is $99 due at the time of your appointment.  You have been referred to our Lipid Clinic here at [facility] and will be contacted to be scheduled.  Follow-Up: At [facility], you and your health needs are our priority.  As part of our continuing mission to provide you with exceptional heart care, we have created designated Provider Care Teams.  These Care Teams include your primary Cardiologist (physician) and Advanced Practice Providers (APPs -  Physician Assistants and Nurse Practitioners) who all work together to provide you with the care you need, when you need it.  We recommend signing up for the patient portal called "MyChart".  Sign up information is provided on this After Visit Summary.  MyChart is used to connect with patients for Virtual Visits (Telemedicine).  Patients are able to view lab/test results, encounter notes, upcoming appointments, etc.  Non-urgent messages can be sent to your provider as well.   To learn more about what  you can do with MyChart, go to [email or web address].    Your next appointment:   Follow up in 1 year with Dr Anne Fu

## 2023-01-22 NOTE — Progress Notes (Signed)
Cardiology Office Note:    Date:  01/22/2023   ID:  [name], DOB 12-18-1955, MRN [id number]  PCP:  Melida Quitter, MD   [facility]  Cardiologist:  Donato Schultz, MD  Advanced Practice Provider:  Rosalio Macadamia, NP (Inactive) Electrophysiologist:  None       Referring MD: Melida Quitter, MD     History of Present Illness:    [name] is a 67 y.o. female here for follow-up of SVT/atrial tachycardia up to 200 bpm.  On flecainide, Dr. Ladona Ridgel.  Dr. Ladona Ridgel suspicion was that both the narrow and wide complex tachycardia PVCs seem more likely both SVT with and without aberrant conduction.  Had an EP study in June 2021 but no ablation.  Since starting low-dose flecainide they have resolved.  Doing well.  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  Family history of coronary artery disease, Father had AFIB, HL, mother had HTN, HL.CAD.   Started 5 mg crestor and LDL has decreased from 188 to 112.  However she started having some leg discomfort and she stopped the medication.  Her LDL went back up to 187 on 11/27/2021.  She saw a rheumatologist as well.  Her rheumatoid factor was positive however she does not have rheumatoid arthritis she states.  She has been able to tolerate 5 mg of Crestor every other day.  Has mild valvular lesions on echocardiogram 2023.  She has quit caffeine and is managing a healthy diet. Denies any fevers chills nausea vomiting syncope bleeding  Past Medical History:  Diagnosis Date   Anxiety    Aortic atherosclerosis (HCC) 09/19/2021   Coronary calcium score-0 in 2019, mild aortic atherosclerosis noted   Depression    History of CT scan    Coronary calcium CT 3/19: Calcium score 0   HTN (hypertension) 05/21/2011   Hypercholesterolemia 04/09/2010   Qualifier: Diagnosis of  By: Manson Passey CMA, Kelly     Hyperlipidemia    Menopause 03/11/2012   Mitral regurgitation    Echocardiogram 03/2022: EF 60-65, GLS -22, normal RVSF, normal  PASP, mild MR, mild to mod TR, mild AI   Palpitations 07/08/2017   Polyp of colon 2009   benign   PVC's (premature ventricular contractions)    a. prior event monitor 02/2014: NSR, rare PVC.   SAB (spontaneous abortion)    Stress    situational   SVT (supraventricular tachycardia) 01/11/2020   SVT (supraventricular tachycardia) 01/11/2020   Atrial tachycardia/SVT/average 200 bpm on monitor 12/2019-flecanide, Dr. Ladona Ridgel   Tiredness 03/11/2012   Trisomy 18    prior pregnancy with Trisomy 18    Past Surgical History:  Procedure Laterality Date   CARPAL TUNNEL RELEASE Right 2004   CESAREAN SECTION     x 2 ( 1 with trisomy 18)   ELECTROPHYSIOLOGY STUDY N/A 02/25/2020   Procedure: ELECTROPHYSIOLOGY STUDY;  Surgeon: Marinus Maw, MD;  [facility]: [facility];  Service: Cardiovascular;  Laterality: N/A;   MYOMECTOMY     SVT ABLATION N/A 02/25/2020   Procedure: SVT ABLATION;  Surgeon: Marinus Maw, MD;  Location: [facility];  Service: Cardiovascular;  Laterality: N/A;   TOTAL ABDOMINAL HYSTERECTOMY W/ BILATERAL SALPINGOOPHORECTOMY  03/04/2006    Current Medications: Current Meds  Medication Sig   aspirin 81 MG tablet Take 81 mg by mouth every other day.   Biotin 5000 MCG TABS Take 10,000 mcg by mouth daily.    Cyanocobalamin (VITAMIN B  12 PO) Take 1,000 mcg by mouth daily.    flecainide (TAMBOCOR) 50 MG tablet TAKE 1 TABLET(50 MG) BY MOUTH TWICE DAILY   folic acid (FOLVITE) 400 MCG tablet Take 400 mcg by mouth 2 (two) times a day.    Magnesium 250 MG TABS Take 250 mg by mouth daily.   Multiple Vitamin (MULTIVITAMIN) tablet Take 1 tablet by mouth daily. Gummy   Vitamin D, Cholecalciferol, 25 MCG (1000 UT) TABS Take 1,000 Units by mouth daily.    [DISCONTINUED] rosuvastatin (CRESTOR) 5 MG tablet TAKE 1 TABLET(5 MG) BY MOUTH DAILY     Allergies:   Amoxicillin, Codeine, Morphine and codeine, and Megace [megestrol]   Social History   Socioeconomic History   Marital  status: Widowed    Spouse name: Not on file   Number of children: Not on file   Years of education: Not on file   Highest education level: Not on file  Occupational History   Not on file  Tobacco Use   Smoking status: Never   Smokeless tobacco: Never  Vaping Use   Vaping Use: Never used  Substance and Sexual Activity   Alcohol use: No   Drug use: No   Sexual activity: Never  Other Topics Concern   Not on file  Social History Narrative   Not on file   Social Determinants of Health   Financial Resource Strain: Not on file  Food Insecurity: Not on file  Transportation Needs: Not on file  Physical Activity: Not on file  Stress: Not on file  Social Connections: Not on file     Family History: The patient's family history includes Atrial fibrillation in her father; Heart attack (age of onset: 20) in her mother; Heart failure in her father; Hypertension in her father and mother; Lung cancer in her father. There is no history of Stroke or Breast cancer.  ROS:   Please see the history of present illness.     All other systems reviewed and are negative.  EKGs/Labs/Other Studies Reviewed:    The following studies were reviewed today:  Personally reviewed lung cancer screening CT-aortic atherosclerosis noted.  Prior coronary calcium score in 2019 was 0.  Zio 01/05/20:     Cardiac Studies & Procedures     STRESS TESTS  EXERCISE TOLERANCE TEST (ETT) 06/29/2020  Narrative  Blood pressure demonstrated a normal response to exercise.  ST segment depression of 1 mm was noted during stress in the II, III, aVF, V6, V5 and V4 leads.  No T wave inversion was noted during stress.  Overall, the patient's exercise capacity was excellent  Duke Treadmill Score: low risk  Negative stress test without evidence of ischemia at given workload. Upsloping ST segment depression <35mm is non-specific. 13.4 METS achieved. Occasional PVC's. No QT or QRS prolongation with exercise (on  flecainide).   ECHOCARDIOGRAM  ECHOCARDIOGRAM COMPLETE 03/25/2022  Narrative ECHOCARDIOGRAM REPORT    Patient Name:   [name] Date of Exam: 03/25/2022 Medical Rec #:  [id number]     Height:       61.5 in Accession #:    [id number]    Weight:       140.0 lb Date of Birth:  October 06, 1955     BSA:          1.633 m Patient Age:    66 years      BP:           122/64 mmHg Patient Gender: F  HR:           78 bpm. Exam Location:  [address]  Procedure: 2D Echo, 3D Echo, Cardiac Doppler, Color Doppler and Strain Analysis  Indications:    I47.1 SVT  History:        Patient has prior history of Echocardiogram examinations, most recent 12/29/2019. Arrythmias:SVT. Palpitation, Signs/Symptoms:Shortness of Breath; Risk Factors:Hypertension, Dyslipidemia and Family History of Coronary Artery Disease. COVID-19.  Sonographer:    Jorje Guild BS, RDCS Referring Phys: [id number] Evern Bio WEAVER  IMPRESSIONS   1. Global longitudinal strain is -22% (normal). Left ventricular ejection fraction, by estimation, is 60 to 65%. The left ventricle has normal function. Left ventricular diastolic parameters were normal. 2. Right ventricular systolic function is normal. The right ventricular size is normal. There is normal pulmonary artery systolic pressure. 3. The mitral valve is normal in structure. Mild mitral valve regurgitation. 4. Tricuspid valve regurgitation is mild to moderate. 5. The aortic valve is tricuspid. Aortic valve regurgitation is mild.  Comparison(s): The left ventricular function is unchanged. 12/29/19 EF 55-60%.  FINDINGS Left Ventricle: Global longitudinal strain is -22%. Left ventricular ejection fraction, by estimation, is 60 to 65%. The left ventricle has normal function. The left ventricular internal cavity size was normal in size. There is no left ventricular hypertrophy. Left ventricular diastolic parameters were normal.  Right Ventricle: The right ventricular size is  normal. Right vetricular wall thickness was not assessed. Right ventricular systolic function is normal. There is normal pulmonary artery systolic pressure. The tricuspid regurgitant velocity is 2.68 m/s, and with an assumed right atrial pressure of 3 mmHg, the estimated right ventricular systolic pressure is 31.7 mmHg.  Left Atrium: Left atrial size was normal in size.  Right Atrium: Right atrial size was normal in size.  Pericardium: Trivial pericardial effusion is present.  Mitral Valve: The mitral valve is normal in structure. Mild mitral valve regurgitation.  Tricuspid Valve: The tricuspid valve is normal in structure. Tricuspid valve regurgitation is mild to moderate.  Aortic Valve: The aortic valve is tricuspid. Aortic valve regurgitation is mild. Aortic regurgitation PHT measures 359 msec.  Pulmonic Valve: The pulmonic valve was not well visualized. Pulmonic valve regurgitation is not visualized.  Aorta: The aortic root and ascending aorta are structurally normal, with no evidence of dilitation.  IAS/Shunts: No atrial level shunt detected by color flow Doppler.   LEFT VENTRICLE PLAX 2D LVIDd:         4.70 cm   Diastology LVIDs:         3.10 cm   LV e' medial:    7.80 cm/s LV PW:         0.70 cm   LV E/e' medial:  9.3 LV IVS:        0.80 cm   LV e' lateral:   10.70 cm/s LVOT diam:     2.40 cm   LV E/e' lateral: 6.8 LV SV:         85 LV SV Index:   52        2D Longitudinal Strain LVOT Area:     4.52 cm  2D Strain GLS (A2C):   -20.5 % 2D Strain GLS (A3C):   -23.8 % 2D Strain GLS (A4C):   -21.8 % 2D Strain GLS Avg:     -22.0 %  3D Volume EF: 3D EF:        56 % LV EDV:       117 ml LV ESV:  51 ml LV SV:        66 ml  RIGHT VENTRICLE             IVC RV Basal diam:  3.10 cm     IVC diam: 1.10 cm RV S prime:     13.60 cm/s TAPSE (M-mode): 2.7 cm RVSP:           31.7 mmHg  LEFT ATRIUM             Index        RIGHT ATRIUM           Index LA diam:        3.50 cm  2.14 cm/m   RA Pressure: 3.00 mmHg LA Vol (A2C):   48.4 ml 29.64 ml/m  RA Area:     12.20 cm LA Vol (A4C):   43.6 ml 26.70 ml/m  RA Volume:   27.70 ml  16.96 ml/m LA Biplane Vol: 46.0 ml 28.17 ml/m AORTIC VALVE LVOT Vmax:   80.50 cm/s LVOT Vmean:  56.600 cm/s LVOT VTI:    0.187 m AI PHT:      359 msec  AORTA Ao Root diam: 2.90 cm Ao Asc diam:  3.10 cm  MITRAL VALVE               TRICUSPID VALVE TR Peak grad:   28.7 mmHg MV Decel Time: 222 msec    TR Vmax:        268.00 cm/s MV E velocity: 72.30 cm/s  Estimated RAP:  3.00 mmHg MV A velocity: 89.10 cm/s  RVSP:           31.7 mmHg MV E/A ratio:  0.81 SHUNTS Systemic VTI:  0.19 m Systemic Diam: 2.40 cm  Dietrich Pates MD Electronically signed by Dietrich Pates MD Signature Date/Time: 03/25/2022/3:34:51 PM    Final    MONITORS  LONG TERM MONITOR (3-14 DAYS) 01/05/2020  Narrative  Paroxysmal supraventricular tachycardia-fastest heart rate 245 bpm, average 208, longest episode 1 minute and 13 seconds  There is another episode with similar heart rates with wider complex tachycardia, possible aberrancy.  Otherwise normal sinus rhythm.  Discussed with Herma Carson Referral to EP. Increased to Toprol. Donato Schultz, MD   CT SCANS  CT CARDIAC SCORING (SELF PAY ONLY) 12/03/2017  Addendum 12/03/2017  8:57 AM ADDENDUM REPORT: 12/03/2017 08:55  CLINICAL DATA:  Risk stratification  EXAM: Coronary Calcium Score  TECHNIQUE: The patient was scanned on a Siemens Somatom 64 slice scanner. Axial non-contrast 3 mm slices were carried out through the heart. The data set was analyzed on a dedicated work station and scored using the Agatson method.  FINDINGS: Non-cardiac: See separate report from [facility].  Ascending Aorta: Normal diameter 3.3 cm  Pericardium: Normal  Coronary arteries: No calcium noted  IMPRESSION: Coronary calcium score of 0.  Charlton Haws   Electronically Signed By: Charlton Haws  M.D. On: 12/03/2017 08:55  Narrative EXAM: OVER-READ INTERPRETATION  CT CHEST  The following report is an over-read performed by radiologist Dr. Charlett Nose of [facility] on 12/03/2017. This over-read does not include interpretation of cardiac or coronary anatomy or pathology. The coronary calcium score interpretation by the cardiologist is attached.  COMPARISON:  None.  FINDINGS: Vascular: Heart is normal size. Visualized aorta is normal caliber. Scattered calcifications in the visualized descending thoracic aorta  Mediastinum/Nodes: No adenopathy in the lower mediastinum or hila.  Lungs/Pleura: Visualized lungs clear.  No effusions.  Upper Abdomen: Imaging  into the upper abdomen shows no acute findings.  Musculoskeletal: Chest wall soft tissues are unremarkable. No acute bony abnormality.  IMPRESSION: Mild aortic atherosclerosis in the descending thoracic aorta. No visible aneurysm.  Electronically Signed: By: Charlett Nose M.D. On: 12/03/2017 08:32          EKG:    09/19/2021: sinus rhythm  rate- 69 bpm QRS- 98 ms  11/24/2020: The ekg ordered today demonstrates sinus rhythm 65 bpm normal QRS duration 98 ms  Recent Labs: No results found for requested labs within last 365 days.  Recent Lipid Panel    Component Value Date/Time   CHOL 197 03/08/2021 0913   TRIG 80 03/08/2021 0913   HDL 71 03/08/2021 0913   CHOLHDL 2.8 03/08/2021 0913   CHOLHDL 4 06/21/2013 0924   VLDL 26.0 06/21/2013 0924   LDLCALC 112 (H) 03/08/2021 0913   LDLDIRECT 160.4 06/21/2013 0924     Risk Assessment/Calculations:      Physical Exam:    VS:  BP 112/82   Pulse 74   Ht 5\' 1"  (1.549 m)   Wt 138 lb 12.8 oz (63 kg)   SpO2 98%   BMI 26.23 kg/m     Wt Readings from Last 3 Encounters:  01/22/23 138 lb 12.8 oz (63 kg)  03/19/22 140 lb (63.5 kg)  03/11/22 138 lb 12.8 oz (63 kg)     GEN: Well nourished, well developed, in no acute distress HEENT: normal Neck:  no JVD, carotid bruits, or masses Cardiac: RRR; no murmurs, rubs, or gallops,no edema  Respiratory:  clear to auscultation bilaterally, normal work of breathing GI: soft, nontender, nondistended, + BS MS: no deformity or atrophy Skin: warm and dry, no rash Neuro:  Alert and Oriented x 3, Strength and sensation are intact Psych: euthymic mood, full affect   ASSESSMENT:    1. Aortic atherosclerosis (HCC)   2. Pure hypercholesterolemia   3. Medication management   4. Statin intolerance   5. SVT (supraventricular tachycardia)      PLAN:    In order of problems listed above:  SVT (supraventricular tachycardia) (HCC) Stable, on flecainide.  Doing very well on low-dose.  No further palpitations or SVT.  She did have an EP study done in June 2021 by Dr. Ladona Ridgel however there was no inducible arrhythmias at that time.  No ablation took place.  No changes made to medications.  Aortic atherosclerosis (HCC) Try for lipid lowering strategy.  Pure hypercholesterolemia Has been able to tolerate  low-dose Crestor 5 mg every other day.  Lipid levels are still quite high 187.  She will take another lipid panel as well as LP(a).  RA factor positive at rheumatologist but she does not have rheumatoid arthritis.  I will have her sit down with our lipid clinic to discuss adjunct alternatives.    Medication Adjustments/Labs and Tests Ordered: Current medicines are reviewed at length with the patient today.  Concerns regarding medicines are outlined above.  Orders Placed This Encounter  Procedures   CT CARDIAC SCORING (SELF PAY ONLY)   Lipid panel   Lipoprotein A (LPA)   AMB Referral to [facility] Pharm[facility]   Meds ordered this encounter  Medications   rosuvastatin (CRESTOR) 5 MG tablet    Sig: TAKE 1 TABLET(5 MG) BY MOUTH DAILY    Dispense:  90 tablet    Refill:  3    Patient Instructions  Medication Instructions:  The current medical regimen is effective;  continue present plan and  medications.  *If you need a refill on your cardiac medications before your next appointment, please call your pharmacy*   Lab Work: Please return fasting for Lipid, LPa lab work.  If you have labs (blood work) drawn today and your tests are completely normal, you will receive your results only by: MyChart Message (if you have MyChart) OR A paper copy in the mail If you have any lab test that is abnormal or we need to change your treatment, we will call you to review the results.   Testing/Procedures: Your physician has requested that you have a Coronary Calcium score which is completed by CT. Cardiac computed tomography (CT) is a painless test that uses an x-ray machine to take clear, detailed pictures of your heart. There are no instructions for this testing.  You may eat/drink and take your normal medications this day.  The cost of the testing is $99 due at the time of your appointment.  You have been referred to our Lipid Clinic here at [facility] and will be contacted to be scheduled.  Follow-Up: At [facility], you and your health needs are our priority.  As part of our continuing mission to provide you with exceptional heart care, we have created designated Provider Care Teams.  These Care Teams include your primary Cardiologist (physician) and Advanced Practice Providers (APPs -  Physician Assistants and Nurse Practitioners) who all work together to provide you with the care you need, when you need it.  We recommend signing up for the patient portal called "MyChart".  Sign up information is provided on this After Visit Summary.  MyChart is used to connect with patients for Virtual Visits (Telemedicine).  Patients are able to view lab/test results, encounter notes, upcoming appointments, etc.  Non-urgent messages can be sent to your provider as well.   To learn more about what you can do with MyChart, go to [email or web address].    Your next appointment:   Follow up  in 1 year with Dr Anne Fu     Signed, Donato Schultz, MD  01/22/2023 5:32 PM    [facility]

## 2023-01-24 ENCOUNTER — Telehealth (HOSPITAL_BASED_OUTPATIENT_CLINIC_OR_DEPARTMENT_OTHER): Payer: Self-pay

## 2023-01-28 ENCOUNTER — Ambulatory Visit: Payer: Medicare Other | Attending: Cardiology

## 2023-01-28 DIAGNOSIS — Z79899 Other long term (current) drug therapy: Secondary | ICD-10-CM

## 2023-01-28 DIAGNOSIS — E7800 Pure hypercholesterolemia, unspecified: Secondary | ICD-10-CM

## 2023-01-28 LAB — LIPID PANEL: VLDL Cholesterol Cal: 13 mg/dL (ref 5–40)

## 2023-01-29 LAB — LIPOPROTEIN A (LPA): Lipoprotein (a): <8.4 nmol/L (ref <75.0)

## 2023-01-29 LAB — LIPID PANEL
Chol/HDL Ratio: 3.1 ratio (ref 0.0–4.4)
Cholesterol, Total: 216 mg/dL — ABNORMAL HIGH (ref 100–199)
HDL: 70 mg/dL (ref >39)
LDL Chol Calc (NIH): 133 mg/dL — ABNORMAL HIGH (ref 0–99)
Triglycerides: 74 mg/dL (ref 0–149)

## 2023-02-17 ENCOUNTER — Ambulatory Visit (HOSPITAL_BASED_OUTPATIENT_CLINIC_OR_DEPARTMENT_OTHER)
Admission: RE | Admit: 2023-02-17 | Discharge: 2023-02-17 | Disposition: A | Discharge status: Home / Self Care | Payer: Medicare Other | Source: Ambulatory Visit | Attending: Cardiology | Admitting: Cardiology

## 2023-02-17 DIAGNOSIS — I700 Atherosclerosis of aorta: Secondary | ICD-10-CM | POA: Insufficient documentation

## 2023-02-24 ENCOUNTER — Other Ambulatory Visit (HOSPITAL_BASED_OUTPATIENT_CLINIC_OR_DEPARTMENT_OTHER): Payer: Self-pay

## 2023-02-24 ENCOUNTER — Ambulatory Visit (HOSPITAL_BASED_OUTPATIENT_CLINIC_OR_DEPARTMENT_OTHER): Payer: Medicare Other

## 2023-02-24 ENCOUNTER — Encounter (HOSPITAL_BASED_OUTPATIENT_CLINIC_OR_DEPARTMENT_OTHER): Payer: Self-pay | Admitting: Student

## 2023-02-24 ENCOUNTER — Ambulatory Visit (INDEPENDENT_AMBULATORY_CARE_PROVIDER_SITE_OTHER): Payer: Medicare Other | Admitting: Student

## 2023-02-24 ENCOUNTER — Other Ambulatory Visit (HOSPITAL_BASED_OUTPATIENT_CLINIC_OR_DEPARTMENT_OTHER): Payer: Self-pay | Admitting: Student

## 2023-02-24 DIAGNOSIS — M25522 Pain in left elbow: Secondary | ICD-10-CM

## 2023-02-24 DIAGNOSIS — M7712 Lateral epicondylitis, left elbow: Secondary | ICD-10-CM | POA: Diagnosis not present

## 2023-02-24 MED ORDER — MELOXICAM 15 MG PO TABS
15.0000 mg | ORAL_TABLET | Freq: Every day | ORAL | 0 refills | Status: AC
  Filled 2023-02-24: qty 10, 10d supply, fill #0

## 2023-02-24 NOTE — Progress Notes (Signed)
Chief Complaint: Left elbow pain     History of Present Illness:    [name] is a 67 y.o. female presents today for evaluation of left elbow pain.  She states that almost 3 weeks ago she hit her elbow on a door frame.  She continues to have pain and swelling which has not improved since the injury.  Pain is moderate in severity with activity particularly with lifting.  Most of her pain is located on the lateral side of the elbow.  Also reports some occasional tingling and stinging down the arm into the fourth and fifth fingers.  She has been wearing a sleeve over the elbow for compression and denies taking any pain medications.  Patient works as a Interior and spatial designer.   Surgical History:   None  PMH/PSH/Family History/Social History/Meds/Allergies:    Past Medical History:  Diagnosis Date   Anxiety    Aortic atherosclerosis (HCC) 09/19/2021   Coronary calcium score-0 in 2019, mild aortic atherosclerosis noted   Depression    History of CT scan    Coronary calcium CT 3/19: Calcium score 0   HTN (hypertension) 05/21/2011   Hypercholesterolemia 04/09/2010   Qualifier: Diagnosis of  By: Manson Passey CMA, Kelly     Hyperlipidemia    Menopause 03/11/2012   Mitral regurgitation    Echocardiogram 03/2022: EF 60-65, GLS -22, normal RVSF, normal PASP, mild MR, mild to mod TR, mild AI   Palpitations 07/08/2017   Polyp of colon 2009   benign   PVC's (premature ventricular contractions)    a. prior event monitor 02/2014: NSR, rare PVC.   SAB (spontaneous abortion)    Stress    situational   SVT (supraventricular tachycardia) 01/11/2020   SVT (supraventricular tachycardia) 01/11/2020   Atrial tachycardia/SVT/average 200 bpm on monitor 12/2019-flecanide, Dr. Ladona Ridgel   Tiredness 03/11/2012   Trisomy 18    prior pregnancy with Trisomy 18   Past Surgical History:  Procedure Laterality Date   CARPAL TUNNEL RELEASE Right 2004   CESAREAN SECTION     x 2 ( 1 with  trisomy 18)   ELECTROPHYSIOLOGY STUDY N/A 02/25/2020   Procedure: ELECTROPHYSIOLOGY STUDY;  Surgeon: Marinus Maw, MD;  Location: [facility];  Service: Cardiovascular;  Laterality: N/A;   MYOMECTOMY     SVT ABLATION N/A 02/25/2020   Procedure: SVT ABLATION;  Surgeon: Marinus Maw, MD;  Location: [facility];  Service: Cardiovascular;  Laterality: N/A;   TOTAL ABDOMINAL HYSTERECTOMY W/ BILATERAL SALPINGOOPHORECTOMY  03/04/2006   Social History   Socioeconomic History   Marital status: Widowed    Spouse name: Not on file   Number of children: Not on file   Years of education: Not on file   Highest education level: Not on file  Occupational History   Not on file  Tobacco Use   Smoking status: Never   Smokeless tobacco: Never  Vaping Use   Vaping Use: Never used  Substance and Sexual Activity   Alcohol use: No   Drug use: No   Sexual activity: Never  Other Topics Concern   Not on file  Social History Narrative   Not on file   Social Determinants of Health   Financial Resource Strain: Not on file  Food Insecurity: Not on file  Transportation Needs: Not on  file  Physical Activity: Not on file  Stress: Not on file  Social Connections: Not on file   Family History  Problem Relation Age of Onset   Hypertension Mother    Heart attack Mother 9   Hypertension Father    Atrial fibrillation Father    Lung cancer Father        LUNG   Heart failure Father    Stroke Neg Hx    Breast cancer Neg Hx    Allergies  Allergen Reactions   Amoxicillin     SKIN BURNING SENSATION   Codeine     nausea   Morphine And Codeine Nausea And Vomiting   Megace [Megestrol] Rash   Current Outpatient Medications  Medication Sig Dispense Refill   meloxicam (MOBIC) 15 MG tablet Take 1 tablet (15 mg total) by mouth daily for 10 days. 10 tablet 0   aspirin 81 MG tablet Take 81 mg by mouth every other day.     Biotin 5000 MCG TABS Take 10,000 mcg by mouth daily.       Cyanocobalamin (VITAMIN B 12 PO) Take 1,000 mcg by mouth daily.      flecainide (TAMBOCOR) 50 MG tablet TAKE 1 TABLET(50 MG) BY MOUTH TWICE DAILY 60 tablet 9   folic acid (FOLVITE) 400 MCG tablet Take 400 mcg by mouth 2 (two) times a day.      Magnesium 250 MG TABS Take 250 mg by mouth daily.     Multiple Vitamin (MULTIVITAMIN) tablet Take 1 tablet by mouth daily. Gummy     rosuvastatin (CRESTOR) 5 MG tablet TAKE 1 TABLET(5 MG) BY MOUTH DAILY 90 tablet 3   Vitamin D, Cholecalciferol, 25 MCG (1000 UT) TABS Take 1,000 Units by mouth daily.      No current facility-administered medications for this visit.   No results found.  Review of Systems:   A ROS was performed including pertinent positives and negatives as documented in the HPI.  Physical Exam :   Constitutional: NAD and appears stated age Neurological: Alert and oriented Psych: Appropriate affect and cooperative There were no vitals taken for this visit.   Comprehensive Musculoskeletal Exam:    Mild swelling of the elbow without any notable erythema.  Tenderness over the lateral epicondyle that is reproduced with resisted wrist extension.  Active range of motion of the elbow is from 0 to 120 degrees.  Neurosensory exam intact.  Imaging:   Xray (Left elbow 2 views): Negative for fracture or dislocation   I personally reviewed and interpreted the radiographs.   Assessment:   67 y.o. female with ongoing left elbow pain that began after an injury few weeks ago.  Her symptoms are consistent with lateral epicondylitis although there is likely an element of a contusion given her mechanism of injury, negative xrays, and previous bruising.  I will start her on a 10-day course of meloxicam to target the inflammation and recommend continuing with rest and ice.  We did discuss that should her symptoms continue we could consider a cortisone injection.  Will proceed with anti-inflammatories and have her return to clinic in 3 to 4 weeks should  symptoms not significantly improve.  Plan :    - Return to clinic as needed     I personally saw and evaluated the patient, and participated in the management and treatment plan.  Hazle Nordmann, PA-C Orthopedics  This document was dictated using Conservation officer, historic buildings. A reasonable attempt at proof reading has been made to  minimize errors. 

## 2023-03-10 ENCOUNTER — Ambulatory Visit: Payer: Medicare Other | Attending: Cardiovascular Disease | Admitting: Pharmacist

## 2023-03-10 DIAGNOSIS — E7800 Pure hypercholesterolemia, unspecified: Secondary | ICD-10-CM

## 2023-03-10 MED ORDER — EZETIMIBE 10 MG PO TABS: 10.0000 mg | ORAL_TABLET | Freq: Every day | ORAL | 3 refills | Status: DC

## 2023-03-10 MED ORDER — ROSUVASTATIN CALCIUM 5 MG PO TABS: 5.0000 mg | ORAL_TABLET | ORAL | 3 refills | Status: DC

## 2023-03-10 NOTE — Patient Instructions (Addendum)
Start taking ezetimbie 10mg  daily Continue rosuvastatin 5mg  every other day  Please get fasting labs done in 2-3 months Please call me at [phone] with any questions

## 2023-03-10 NOTE — Assessment & Plan Note (Signed)
Assessment: LDL-C is above goal less than 70. Reviewed patient's coronary calcium score with her Diet reviewed and is mostly optimized Patient is active but encourage some more strength training She is hesitant to try new medications and is not interested in injections at this time We reviewed the data for statins, ezetimibe, PCSK9 inhibitors, bempedoic acid and inclisiran We reviewed mendelian randomization studies that showed for every 38 mg/dL lowering of LDL-C you reduce the relative risk of a cardiovascular event by about 21%   Plan: Patient agreeable to adding ezetimibe 10mg  daily Although this is unlikely to get LDL cholesterol to goal, she is not interested in injections at this time Considered nextlizet however patient only wants to add 1 medication at a time which is very understandable Continue rosuvastatin 5 mg every other day, this is the highest dose she tolerates Recheck cholesterol in 2 to 3 months.  Labs have been ordered and patient given slips to get at either med [facility] or [place] office

## 2023-03-10 NOTE — Progress Notes (Signed)
Patient ID: [name]                 DOB: 1955-11-20                    MRN: [id number]      HPI: [name] is a 67 y.o. female patient referred to lipid clinic by Dr. Anne Fu. PMH is significant for SVT, HTN and HLD.   Family history of coronary artery disease, Father had AFIB, HL, mother had HTN, HL.CAD. Started 5 mg crestor and LDL has decreased from 188 to 112.  However she started having some leg discomfort and she stopped the medication.  Her LDL went back up to 187 on 11/27/2021.  She saw a rheumatologist as well.  Her rheumatoid factor was positive however she does not have rheumatoid arthritis she states.  She has been able to tolerate 5 mg of Crestor every other day. CAC score of 0 in 2019. Showed aortic atherosclerosis. Last LDL-C 133. LP(a) normal at <8.4 nmol/L.   Patient presents today to clinic.  She reports that she is doing great on rosuvastatin every other day.  Reports being very sensitive to medications.  She recently had a new coronary calcium score done with a score of 11 which put her in the 60th percentile for age and sex.  I reviewed what this score meant and the effect that her statin use could have had on her score.  Patient has an active job as a Scientist, research (medical).  She does do some exercise outside of work as well.  Reviewed options for lowering LDL cholesterol, including ezetimibe, PCSK-9 inhibitors, bempedoic acid and inclisiran.  Discussed mechanisms of action, dosing, side effects and potential decreases in LDL cholesterol.  Also reviewed cost information and potential options for patient assistance.   Current Medications: rosuvastatin 5mg  every other day Intolerances: Rosuvastatin 5 mg daily (leg pain) Risk Factors: HTN LDL-C goal: <70  Diet:  Breakfast: 2 scrambled eggs, 1/2 avocado or fruit or cottage cheese/greek yogurt w/ fruit Lunch: almonds, pistaiatos, yogurt, cottage cheese apples Dinner: greek salad w/ chicken, broccoli and chicken, sweet potatoes   Drink: water, unsweet icetea, decaf coffee w/ 1/2 and 1/2  Exercise: hair stylist, yard work, swimming in summer or handweights, walking in cooler weather  Family History: The patient's family history includes Atrial fibrillation in her father; Heart attack (age of onset: 68) in her mother; Heart failure in her father; Hypertension in her father and mother; Lung cancer in her father. There is no history of Stroke or Breast cancer.  Social History: no tobacco, rare ETOH  Labs: Lipid Panel     Component Value Date/Time   CHOL 216 (H) 01/28/2023 1054   TRIG 74 01/28/2023 1054   HDL 70 01/28/2023 1054   CHOLHDL 3.1 01/28/2023 1054   CHOLHDL 4 06/21/2013 0924   VLDL 26.0 06/21/2013 0924   LDLCALC 133 (H) 01/28/2023 1054   LDLDIRECT 160.4 06/21/2013 0924   LABVLDL 13 01/28/2023 1054    Past Medical History:  Diagnosis Date   Anxiety    Aortic atherosclerosis (HCC) 09/19/2021   Coronary calcium score-0 in 2019, mild aortic atherosclerosis noted   Depression    History of CT scan    Coronary calcium CT 3/19: Calcium score 0   HTN (hypertension) 05/21/2011   Hypercholesterolemia 04/09/2010   Qualifier: Diagnosis of  By: Manson Passey CMA, Kelly     Hyperlipidemia    Menopause 03/11/2012   Mitral regurgitation  Echocardiogram 03/2022: EF 60-65, GLS -22, normal RVSF, normal PASP, mild MR, mild to mod TR, mild AI   Palpitations 07/08/2017   Polyp of colon 2009   benign   PVC's (premature ventricular contractions)    a. prior event monitor 02/2014: NSR, rare PVC.   SAB (spontaneous abortion)    Stress    situational   SVT (supraventricular tachycardia) 01/11/2020   SVT (supraventricular tachycardia) 01/11/2020   Atrial tachycardia/SVT/average 200 bpm on monitor 12/2019-flecanide, Dr. Ladona Ridgel   Tiredness 03/11/2012   Trisomy 18    prior pregnancy with Trisomy 18    Current Outpatient Medications on File Prior to Visit  Medication Sig Dispense Refill   aspirin 81 MG tablet Take 81 mg  by mouth every other day.     Biotin 5000 MCG TABS Take 10,000 mcg by mouth daily.      Cyanocobalamin (VITAMIN B 12 PO) Take 1,000 mcg by mouth daily.      flecainide (TAMBOCOR) 50 MG tablet TAKE 1 TABLET(50 MG) BY MOUTH TWICE DAILY 60 tablet 9   folic acid (FOLVITE) 400 MCG tablet Take 400 mcg by mouth 2 (two) times a day.      Magnesium 250 MG TABS Take 250 mg by mouth daily.     Multiple Vitamin (MULTIVITAMIN) tablet Take 1 tablet by mouth daily. Gummy     Vitamin D, Cholecalciferol, 25 MCG (1000 UT) TABS Take 1,000 Units by mouth daily.      No current facility-administered medications on file prior to visit.    Allergies  Allergen Reactions   Amoxicillin     SKIN BURNING SENSATION   Codeine     nausea   Morphine And Codeine Nausea And Vomiting   Megace [Megestrol] Rash    Assessment/Plan:  1. Hyperlipidemia -  Pure hypercholesterolemia Assessment: LDL-C is above goal less than 70. Reviewed patient's coronary calcium score with her Diet reviewed and is mostly optimized Patient is active but encourage some more strength training She is hesitant to try new medications and is not interested in injections at this time We reviewed the data for statins, ezetimibe, PCSK9 inhibitors, bempedoic acid and inclisiran We reviewed mendelian randomization studies that showed for every 38 mg/dL lowering of LDL-C you reduce the relative risk of a cardiovascular event by about 21%   Plan: Patient agreeable to adding ezetimibe 10mg  daily Although this is unlikely to get LDL cholesterol to goal, she is not interested in injections at this time Considered nextlizet however patient only wants to add 1 medication at a time which is very understandable Continue rosuvastatin 5 mg every other day, this is the highest dose she tolerates Recheck cholesterol in 2 to 3 months.  Labs have been ordered and patient given slips to get at either med [facility] or [place] office    Thank  you,  Olene Floss, Pharm.D, BCACP, BCPS, CPP [facility] A Division of [facility] [address], [place], [place] [address]  Phone: [phone]; Fax: [phone]

## 2023-05-08 ENCOUNTER — Other Ambulatory Visit: Payer: Self-pay | Admitting: Internal Medicine

## 2023-05-08 ENCOUNTER — Other Ambulatory Visit: Payer: Self-pay

## 2023-05-08 MED ORDER — FLECAINIDE ACETATE 50 MG PO TABS: 50.0000 mg | ORAL_TABLET | Freq: Two times a day (BID) | ORAL | 0 refills | Status: DC

## 2023-06-02 DIAGNOSIS — K080 Exfoliation of teeth due to systemic causes: Secondary | ICD-10-CM | POA: Diagnosis not present

## 2023-06-07 ENCOUNTER — Other Ambulatory Visit: Payer: Self-pay | Admitting: Internal Medicine

## 2023-07-16 DIAGNOSIS — D1801 Hemangioma of skin and subcutaneous tissue: Secondary | ICD-10-CM | POA: Diagnosis not present

## 2023-07-16 DIAGNOSIS — R58 Hemorrhage, not elsewhere classified: Secondary | ICD-10-CM | POA: Diagnosis not present

## 2023-07-16 IMAGING — CT CT CHEST W/O CM
2 of 4 series · 13 of 36 positions shown, 16 images · non-contrast
Comparison: 07/05/2020

CLINICAL DATA: Follow-up lung nodules.

EXAM:
CT CHEST WITHOUT CONTRAST
TECHNIQUE: Multidetector CT imaging of the chest was performed following the
standard protocol without IV contrast.

[Series 2: chest 2.00 br40 s3 · axial · 0.55mm/px · z∈[+1574,+1848]mm · 10 of 163 slices shown, 13 images (1 of 2)]
[im 13/163  mediastinal]
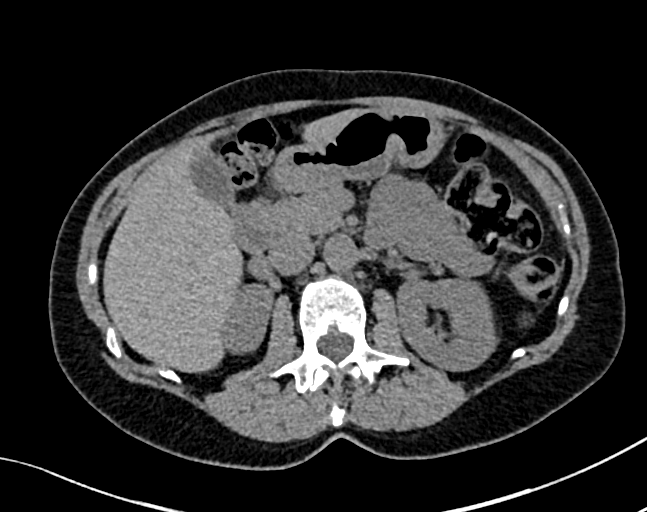
[im 13/163  lung]
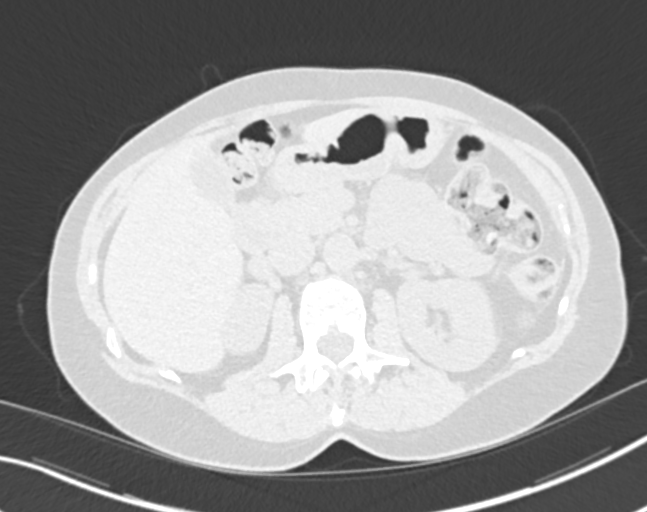
[im 25/163  lung]
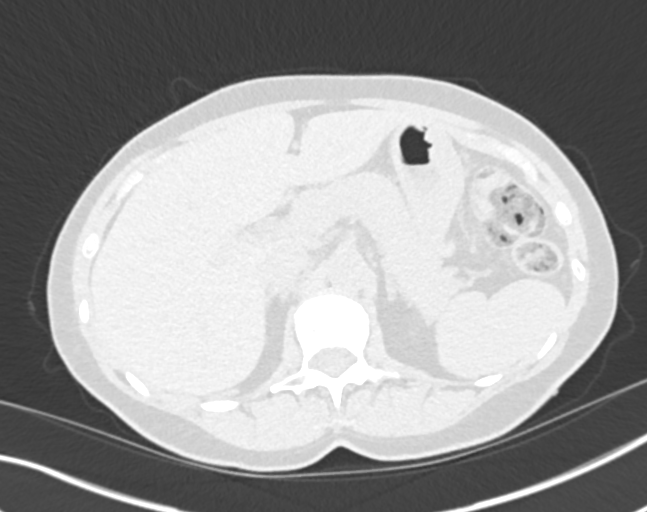
[im 50/163  lung]
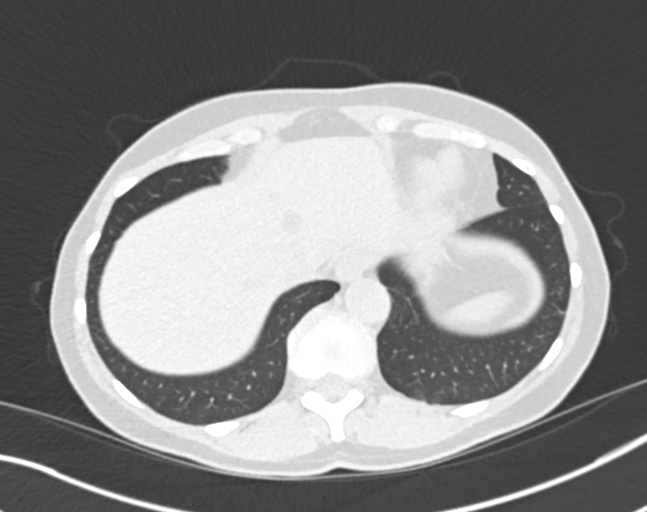
[im 63/163  lung]
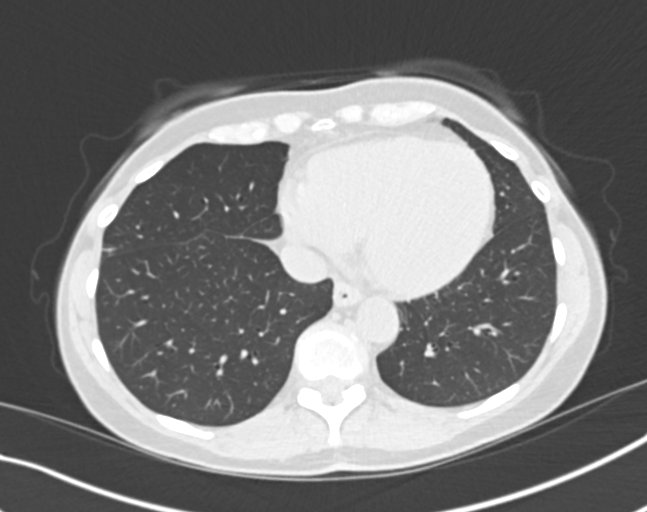
[im 75/163  mediastinal]
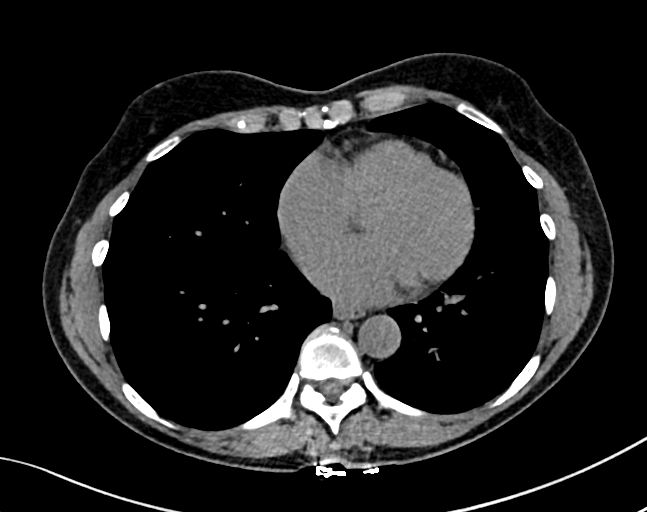
[im 75/163  lung]
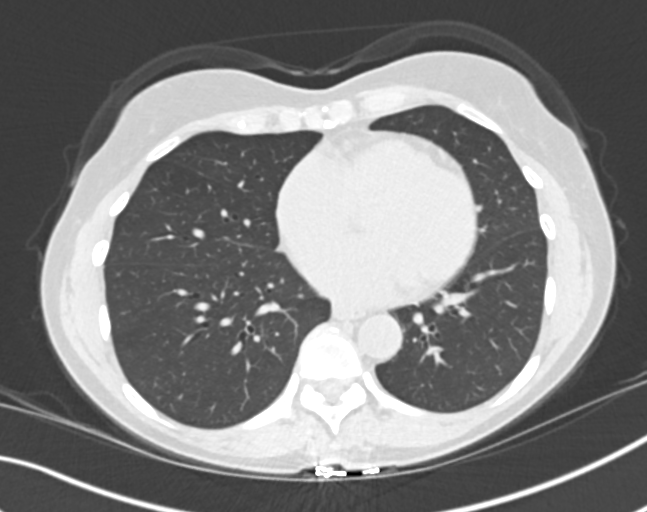
[im 88/163  lung]
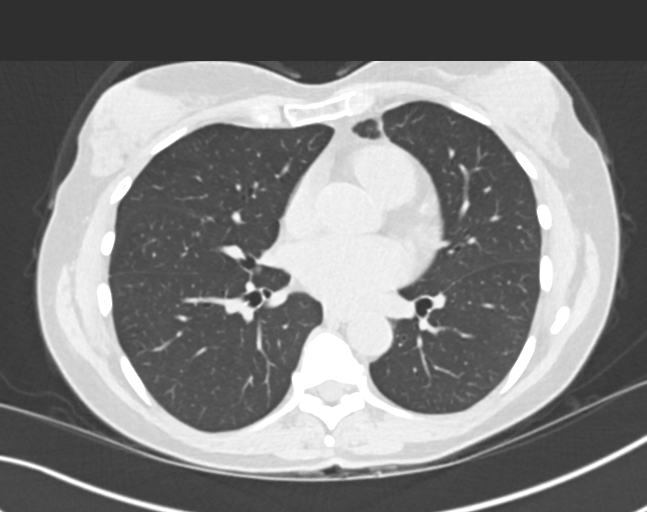
[im 100/163  lung]
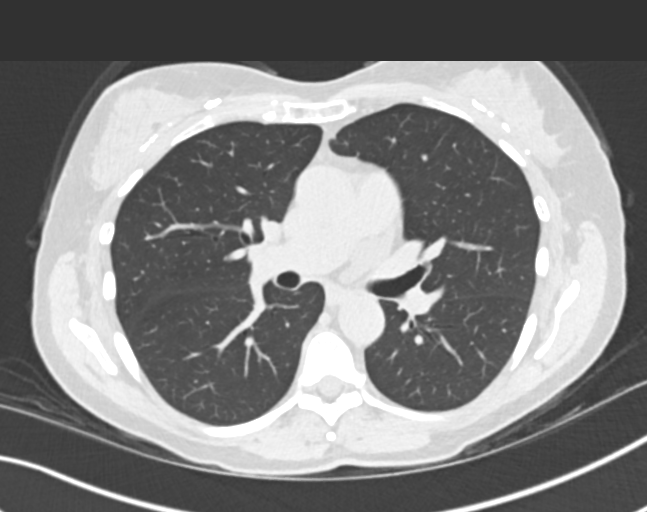
[im 125/163  lung]
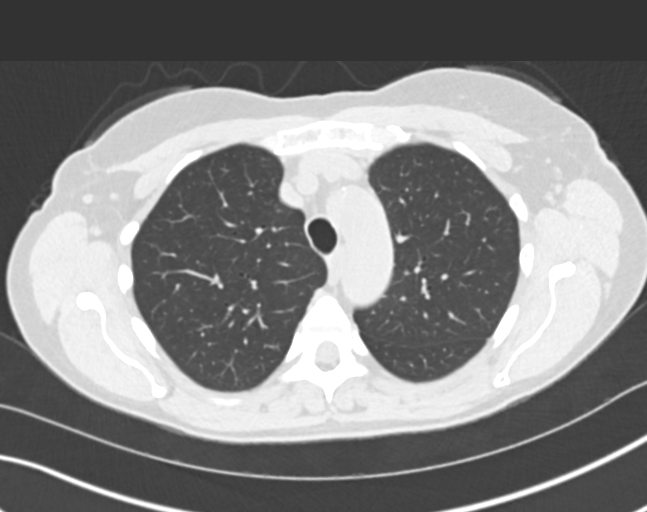
[im 138/163  mediastinal]
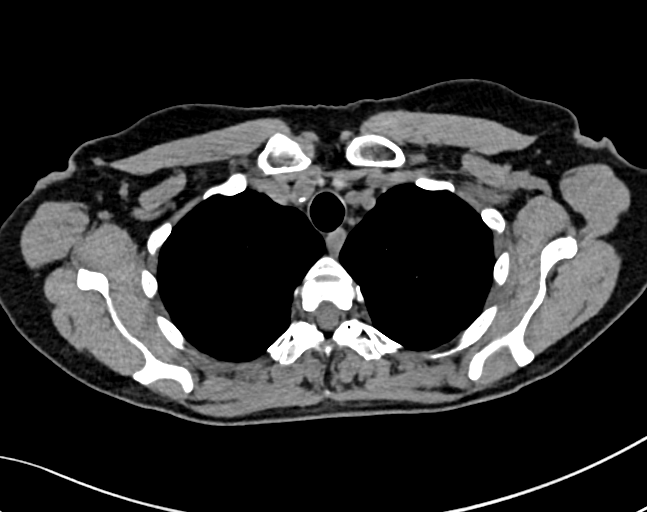
[im 138/163  lung]
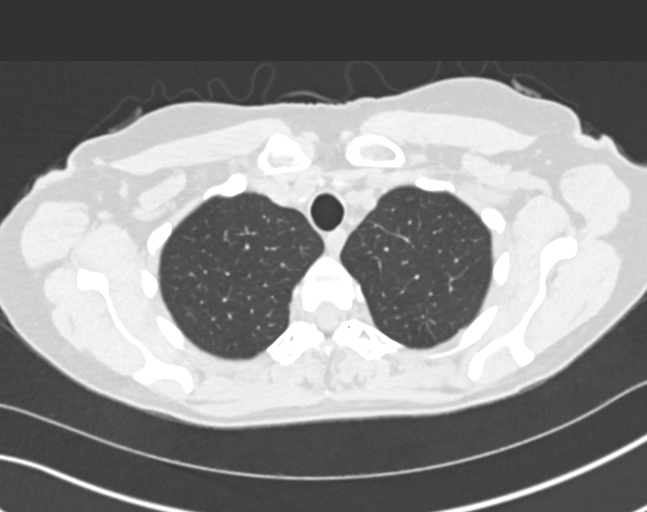
[im 150/163  lung]
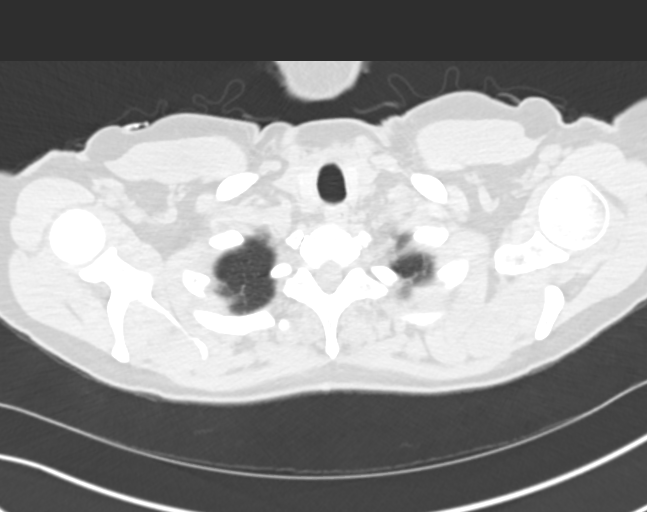

[Series 4: chest 2.00 br40 s3 · coronal · 0.64mm/px · 3 of 141 slices shown (2 of 2)]
[im 29/141  lung]
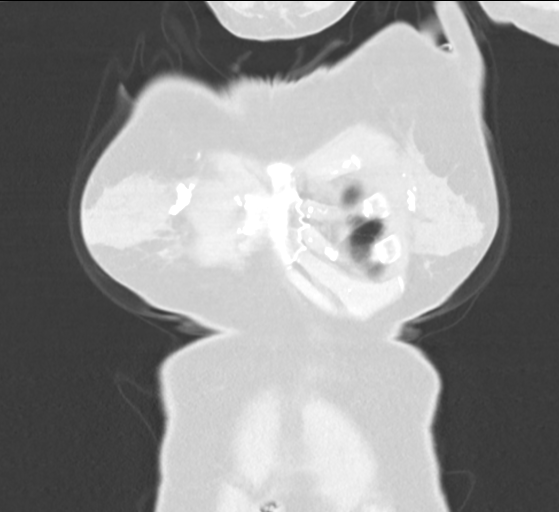
[im 57/141  lung]
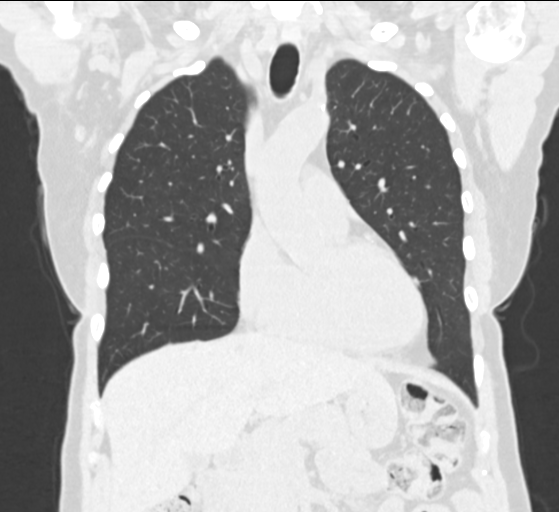
[im 85/141  lung]
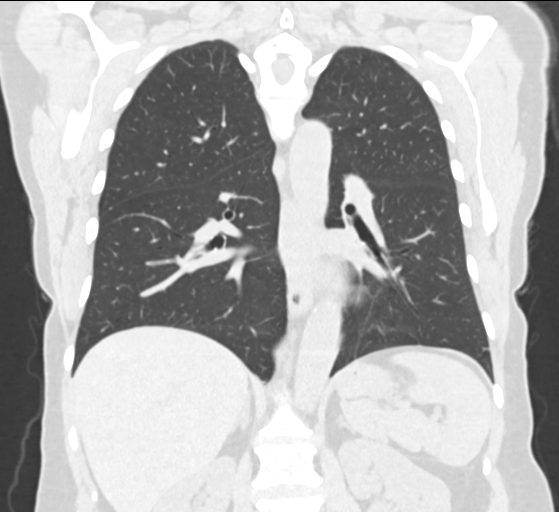

[13 of 36 positions shown; findings below may reference images not displayed]

FINDINGS: Cardiovascular: Aortic atherosclerosis. Normal heart size. No
pericardial effusion.

Mediastinum/Nodes: No enlarged mediastinal, hilar, or axillary lymph
nodes. Thyroid gland, trachea, and esophagus demonstrate no
significant findings.

Lungs/Pleura: Occasional tiny pulmonary nodules are stable and
definitively benign, for example a calcified 3 mm nodule of the
peripheral left upper lobe (8, image 33). No pleural effusion or
pneumothorax.

Upper Abdomen: No acute abnormality.

Musculoskeletal: No chest wall mass or suspicious bone lesions
identified.
IMPRESSION: Occasional tiny pulmonary nodules are stable and definitively
benign. No further follow-up or characterization is required.

Aortic Atherosclerosis ([id number]).

## 2023-07-22 ENCOUNTER — Encounter: Payer: Self-pay | Admitting: Family Medicine

## 2023-07-22 ENCOUNTER — Ambulatory Visit: Payer: Medicare Other | Admitting: Family Medicine

## 2023-07-22 VITALS — BP 124/76 | HR 74 | Temp 97.7°F | Ht 62.0 in | Wt 137.4 lb

## 2023-07-22 DIAGNOSIS — I700 Atherosclerosis of aorta: Secondary | ICD-10-CM

## 2023-07-22 DIAGNOSIS — I471 Supraventricular tachycardia, unspecified: Secondary | ICD-10-CM | POA: Diagnosis not present

## 2023-07-22 DIAGNOSIS — I340 Nonrheumatic mitral (valve) insufficiency: Secondary | ICD-10-CM

## 2023-07-22 DIAGNOSIS — E7800 Pure hypercholesterolemia, unspecified: Secondary | ICD-10-CM | POA: Diagnosis not present

## 2023-07-22 DIAGNOSIS — I10 Essential (primary) hypertension: Secondary | ICD-10-CM | POA: Diagnosis not present

## 2023-07-22 NOTE — Assessment & Plan Note (Signed)
Stable.   Plan:  Continue flecainide 50?mg twice daily. Monitor for any new cardiac symptoms. Maintain regular follow-ups with cardiology.

## 2023-07-22 NOTE — Progress Notes (Signed)
Assessment/Plan:   Problem List Items Addressed This Visit       Cardiovascular and Mediastinum   SVT (supraventricular tachycardia) (HCC) (Chronic)    Stable.   Plan:  Continue flecainide 50?mg twice daily. Monitor for any new cardiac symptoms. Maintain regular follow-ups with cardiology.      Aortic atherosclerosis (HCC) (Chronic)   Mitral regurgitation (Chronic)    Asymptomatic.  Murmur appreciated.      RESOLVED: HTN (hypertension) - Primary     Other   Pure hypercholesterolemia (Chronic)    Uncontrolled. Associated with aortic catheter).  LDL goal less than 70 mg/dL.  Most Recent LDL cholesterol is 133?mg/dL. Patient expresses concern about taking multiple cholesterol medications due to potential side effects, particularly worries about ezetimibe causing GI cancer.  Plan: Continue rosuvastatin 5?mg every other day. Discussed risks and benefits of appropriate lipid management Encouraged lifestyle modifications including diet and regular exercise to lower LDL levels. Recommend follow up with Lipid clinic and consider alternative therapies if necessary.       There are no discontinued medications.  Return in about 3 months (around 10/22/2023) for physical (fasting labs).    Subjective:   Encounter date: 07/22/2023  [name] is a 67 y.o. female who has Pure hypercholesterolemia; History of colonic polyps; SVT (supraventricular tachycardia) (HCC); Aortic atherosclerosis (HCC); and Mitral regurgitation on their problem list..   She  has a past medical history of Anxiety, Aortic atherosclerosis (HCC) (09/19/2021), Depression, History of CT scan, HTN (hypertension) (05/21/2011), Hypercholesterolemia (04/09/2010), Hyperlipidemia, Menopause (03/11/2012), Mitral regurgitation, Palpitations (07/08/2017), Polyp of colon (2009), PVC's (premature ventricular contractions), SAB (spontaneous abortion), Stress, SVT (supraventricular tachycardia) (HCC) (01/11/2020), SVT  (supraventricular tachycardia) (HCC) (01/11/2020), Tiredness (03/11/2012), and Trisomy 18..   Chief Complaint: Here to establish care.  History of Present Illness:  Patient is a 67 year old establishing primary care. Has a history of hyperlipidemia, supraventricular tachycardia (SVT), aortic atherosclerosis, and previously diagnosed hypertension during a stressful period. Currently under the care of Lipid Clinic and cardiology.  Medications include rosuvastatin 5?mg every other day, ezetimibe (dose not specified), flecainide 50?mg twice daily, aspirin 81?mg daily, and vitamins. Expresses concerns about taking multiple cholesterol medications due to potential side effects, specifically fears about ezetimibe increasing cancer risk influenced by spouse's history of pancreatic cancer. Prefers to focus on lifestyle modifications for cholesterol management.  Denies chest pain, palpitations, shortness of breath, or other cardiovascular symptoms. Reports feeling well overall.  ROS  Past Surgical History:  Procedure Laterality Date   CARPAL TUNNEL RELEASE Right 2004   CESAREAN SECTION     x 2 ( 1 with trisomy 18)   ELECTROPHYSIOLOGY STUDY N/A 02/25/2020   Procedure: ELECTROPHYSIOLOGY STUDY;  Surgeon: Marinus Maw, MD;  Location: [facility] INVASIVE CV [facility];  Service: Cardiovascular;  Laterality: N/A;   MYOMECTOMY     SVT ABLATION N/A 02/25/2020   Procedure: SVT ABLATION;  Surgeon: Marinus Maw, MD;  Location: [facility] INVASIVE CV LAB;  Service: Cardiovascular;  Laterality: N/A;   TOTAL ABDOMINAL HYSTERECTOMY W/ BILATERAL SALPINGOOPHORECTOMY  03/04/2006    Outpatient Medications Prior to Visit  Medication Sig Dispense Refill   aspirin 81 MG tablet Take 81 mg by mouth every other day.     Biotin 5000 MCG TABS Take 10,000 mcg by mouth daily.      Cyanocobalamin (VITAMIN B 12 PO) Take 1,000 mcg by mouth daily.      flecainide (TAMBOCOR) 50 MG tablet TAKE 1 TABLET(50 MG) BY MOUTH TWICE DAILY 30 tablet 0  folic acid (FOLVITE) 400 MCG tablet Take 400 mcg by mouth 2 (two) times a day.      Magnesium 250 MG TABS Take 250 mg by mouth daily.     rosuvastatin (CRESTOR) 5 MG tablet Take 1 tablet (5 mg total) by mouth every other day. 45 tablet 3   Vitamin D, Cholecalciferol, 25 MCG (1000 UT) TABS Take 1,000 Units by mouth daily.      ezetimibe (ZETIA) 10 MG tablet Take 1 tablet (10 mg total) by mouth daily. 90 tablet 3   Multiple Vitamin (MULTIVITAMIN) tablet Take 1 tablet by mouth daily. Gummy (Patient not taking: Reported on 07/22/2023)     No facility-administered medications prior to visit.    Family History  Problem Relation Age of Onset   Hypertension Mother    Heart attack Mother 60   Hypertension Father    Atrial fibrillation Father    Lung cancer Father        LUNG   Heart failure Father    Stroke Neg Hx    Breast cancer Neg Hx     Social History   Socioeconomic History   Marital status: Widowed    Spouse name: Not on file   Number of children: Not on file   Years of education: Not on file   Highest education level: Not on file  Occupational History   Not on file  Tobacco Use   Smoking status: Never    Passive exposure: Never   Smokeless tobacco: Never  Vaping Use   Vaping status: Never Used  Substance and Sexual Activity   Alcohol use: No   Drug use: No   Sexual activity: Never  Other Topics Concern   Not on file  Social History Narrative   Not on file   Social Determinants of Health   Financial Resource Strain: Not on file  Food Insecurity: Not on file  Transportation Needs: Not on file  Physical Activity: Not on file  Stress: Not on file  Social Connections: Not on file  Intimate Partner Violence: Not on file                                                                                                  Objective:  Physical Exam: BP 124/76 (BP Location: Left Arm, Patient Position: Sitting, Cuff Size: Large)   Pulse 74   Temp 97.7 F (36.5 C)  (Temporal)   Ht 5\' 2"  (1.575 m)   Wt 137 lb 6.4 oz (62.3 kg)   SpO2 99%   BMI 25.13 kg/m     Physical Exam Constitutional:      General: She is not in acute distress.    Appearance: Normal appearance. She is not ill-appearing or toxic-appearing.  HENT:     Head: Normocephalic and atraumatic.     Nose: Nose normal. No congestion.  Eyes:     General: No scleral icterus.    Extraocular Movements: Extraocular movements intact.  Cardiovascular:     Rate and Rhythm: Normal rate and regular rhythm.     Pulses: Normal pulses.  Heart sounds: Murmur heard.  Pulmonary:     Effort: Pulmonary effort is normal. No respiratory distress.     Breath sounds: Normal breath sounds.  Abdominal:     General: Abdomen is flat. Bowel sounds are normal.     Palpations: Abdomen is soft.  Musculoskeletal:        General: Normal range of motion.  Lymphadenopathy:     Cervical: No cervical adenopathy.  Skin:    General: Skin is warm and dry.     Findings: No rash.  Neurological:     General: No focal deficit present.     Mental Status: She is alert and oriented to person, place, and time. Mental status is at baseline.  Psychiatric:        Mood and Affect: Mood normal.        Behavior: Behavior normal.        Thought Content: Thought content normal.        Judgment: Judgment normal.     Prior labs:   No results found for this or any previous visit (from the past 2160 hour(s)).  Lab Results  Component Value Date   CHOL 216 (H) 01/28/2023   CHOL 197 03/08/2021   CHOL 280 (H) 11/24/2020   Lab Results  Component Value Date   HDL 70 01/28/2023   HDL 71 03/08/2021   HDL 78 11/24/2020   Lab Results  Component Value Date   LDLCALC 133 (H) 01/28/2023   LDLCALC 112 (H) 03/08/2021   LDLCALC 188 (H) 11/24/2020   Lab Results  Component Value Date   TRIG 74 01/28/2023   TRIG 80 03/08/2021   TRIG 86 11/24/2020   Lab Results  Component Value Date   CHOLHDL 3.1 01/28/2023   CHOLHDL  2.8 03/08/2021   CHOLHDL 3.6 11/24/2020   Lab Results  Component Value Date   LDLDIRECT 160.4 06/21/2013    Last metabolic panel Lab Results  Component Value Date   GLUCOSE 76 02/22/2020   NA 139 02/22/2020   K 4.4 02/22/2020   CL 103 02/22/2020   CO2 30 (H) 02/22/2020   BUN 20 02/22/2020   CREATININE 0.70 02/22/2020   GFRNONAA 93 02/22/2020   CALCIUM 9.8 02/22/2020   PROT 6.3 12/07/2019   ALBUMIN 4.5 12/07/2019   LABGLOB 1.8 12/07/2019   AGRATIO 2.5 (H) 12/07/2019   BILITOT <0.2 12/07/2019   ALKPHOS 106 12/07/2019   AST 17 12/07/2019   ALT 31 03/08/2021    No results found for: "HGBA1C"  Last CBC Lab Results  Component Value Date   WBC 5.8 02/22/2020   HGB 14.8 02/22/2020   HCT 43.8 02/22/2020   MCV 92 02/22/2020   MCH 31.1 02/22/2020   RDW 13.6 02/22/2020   PLT 267 02/22/2020    Lab Results  Component Value Date   TSH 2.760 12/07/2019      Last vitamin D Lab Results  Component Value Date   VD25OH 41 03/11/2012    Lab Results  Component Value Date   BILIRUBINUR NEG 03/11/2012   PROTEINUR NEG 03/11/2012   UROBILINOGEN 0.2 03/11/2012   LEUKOCYTESUR NEG 03/11/2012   CT Cardiac Scoring: Performed June 2024 showing evidence of aortic atherosclerosis.  ADDENDUM REPORT: 02/23/2023 03:33   EXAM: OVER-READ INTERPRETATION  CT CHEST   The following report is an over-read performed by radiologist Dr. Alcide Clever of [facility] on 02/23/2023. This over-read does not include interpretation of cardiac or coronary anatomy or pathology. The coronary calcium score  interpretation by the cardiologist is attached.   COMPARISON:  06/19/2021   FINDINGS: Cardiovascular: There are no significant extracardiac vascular findings.   Mediastinum/Nodes: There are no enlarged lymph nodes within the visualized mediastinum.   Lungs/Pleura: There is no pleural effusion. The visualized lungs appear clear.   Upper abdomen: Stable hepatic cysts are  noted.   Musculoskeletal/Chest wall: No chest wall mass or suspicious osseous findings within the visualized chest.   IMPRESSION: No significant extracardiac findings within the visualized chest.     Electronically Signed   By: Alcide Clever M.D.   On: 02/23/2023 03:33    Addended by Alcide Clever, MD on 02/23/2023  3:35 AM    Study Result  Narrative & Impression  CLINICAL DATA:  Risk stratification: 67 Year-old Female   EXAM: Coronary Calcium Score   TECHNIQUE: The patient was scanned on a [employer] scanner. Axial non-contrast 3 mm slices were carried out through the heart. The data set was analyzed on a dedicated work station and scored using the Agatson method.   FINDINGS: Non-cardiac: See separate report from [facility].   Ascending Aorta: Normal caliber. Aortic atherosclerosis.   Aortic Valve Calcium score: 0   Mitral annular calcification: None   Pericardium: Normal.   Coronary arteries: Normal origins.   Coronary Calcium Score:   Left main: 0   Left anterior descending artery: 5   Left circumflex artery: 0   Right coronary artery: 6   Total: 11   Percentile: 60th for age, sex, and race matched control.   IMPRESSION: 1. Coronary calcium score of 11. This was 60th percentile for age, gender, and race matched controls.   2. Aortic atherosclerosis.   RECOMMENDATIONS:   The proposed cut-off value of 1,651 AU yielded a 93 % sensitivity and 75 % specificity in grading AS severity in patients with classical low-flow, low-gradient AS. Proposed different cut-off values to define severe AS for men and women as 2,065 AU and 1,274 AU, respectively. The joint European and American recommendations for the assessment of AS consider the aortic valve calcium score as a continuum - a very high calcium score suggests severe AS and a low calcium score suggests severe AS is unlikely.   Sunday Shams, et al. 2017 ESC/EACTS Guidelines  for the management of valvular heart disease. Eur Heart J 2017;38:2739-91.   Coronary artery calcium (CAC) score is a strong predictor of incident coronary heart disease (CHD) and provides predictive information beyond traditional risk factors. CAC scoring is reasonable to use in the decision to withhold, postpone, or initiate statin therapy in intermediate-risk or selected borderline-risk asymptomatic adults (age 8-75 years and LDL-C >=70 to <190 mg/dL) who do not have diabetes or established atherosclerotic cardiovascular disease (ASCVD).* In intermediate-risk (10-year ASCVD risk >=7.5% to <20%) adults or selected borderline-risk (10-year ASCVD risk >=5% to <7.5%) adults in whom a CAC score is measured for the purpose of making a treatment decision the following recommendations have been made:   If CAC = 0, it is reasonable to withhold statin therapy and reassess in 5 to 10 years, as long as higher risk conditions are absent (diabetes mellitus, family history of premature CHD in first degree relatives (males <55 years; females <65 years), cigarette smoking, LDL >=190 mg/dL or other independent risk factors).   If CAC is 1 to 99, it is reasonable to initiate statin therapy for patients >=79 years of age.   If CAC is >=100 or >=75th percentile, it is reasonable  to initiate statin therapy at any age.   Cardiology referral should be considered for patients with CAC scores =400 or >=75th percentile.   *2018 AHA/ACC/AACVPR/AAPA/ABC/ACPM/ADA/AGS/APhA/ASPC/NLA/PCNA Guideline on the Management of Blood Cholesterol: A Report of the American College of Cardiology/American Heart Association Task Force on Clinical Practice Guidelines. J Am Coll Cardiol. 2019;73(24):3168-3209.   Riley Lam, MD   Electronically Signed: By: Riley Lam M.D. On: 02/17/2023 13:10   Echocardiogram - 03/25/2022  IMPRESSIONS     1. Global longitudinal strain is -22% (normal). Left  ventricular ejection  fraction, by estimation, is 60 to 65%. The left ventricle has normal  function. Left ventricular diastolic parameters were normal.   2. Right ventricular systolic function is normal. The right ventricular  size is normal. There is normal pulmonary artery systolic pressure.   3. The mitral valve is normal in structure. Mild mitral valve  regurgitation.   4. Tricuspid valve regurgitation is mild to moderate.   5. The aortic valve is tricuspid. Aortic valve regurgitation is mild.   Comparison(s): The left ventricular function is unchanged. 12/29/19 EF  55-60%.   No results found for: "LABMICR", "MICROALBUR"       Garner Nash, MD, MS

## 2023-07-22 NOTE — Patient Instructions (Signed)
It was a pleasure to see you today! Thank you for choosing [facility] for your primary care. [name] was seen for establishing care.

## 2023-07-22 NOTE — Assessment & Plan Note (Signed)
Asymptomatic.  Murmur appreciated.

## 2023-07-22 NOTE — Assessment & Plan Note (Addendum)
Uncontrolled. Associated with aortic catheter).  LDL goal less than 70 mg/dL.  Most Recent LDL cholesterol is 133?mg/dL. Patient expresses concern about taking multiple cholesterol medications due to potential side effects, particularly worries about ezetimibe causing GI cancer.  Plan: Continue rosuvastatin 5?mg every other day. Discussed risks and benefits of appropriate lipid management Encouraged lifestyle modifications including diet and regular exercise to lower LDL levels. Recommend follow up with Lipid clinic and consider alternative therapies if necessary.

## 2023-07-28 ENCOUNTER — Other Ambulatory Visit: Payer: Self-pay | Admitting: Internal Medicine

## 2023-08-04 DIAGNOSIS — R768 Other specified abnormal immunological findings in serum: Secondary | ICD-10-CM | POA: Diagnosis not present

## 2023-08-04 DIAGNOSIS — M1991 Primary osteoarthritis, unspecified site: Secondary | ICD-10-CM | POA: Diagnosis not present

## 2023-08-04 DIAGNOSIS — M791 Myalgia, unspecified site: Secondary | ICD-10-CM | POA: Diagnosis not present

## 2023-08-06 ENCOUNTER — Telehealth: Payer: Self-pay | Admitting: Internal Medicine

## 2023-08-06 ENCOUNTER — Other Ambulatory Visit: Payer: Self-pay

## 2023-08-06 MED ORDER — FLECAINIDE ACETATE 50 MG PO TABS: 50.0000 mg | ORAL_TABLET | Freq: Two times a day (BID) | ORAL | 0 refills | Status: DC

## 2023-08-06 NOTE — Telephone Encounter (Signed)
Pt c/o medication issue:  1. Name of Medication: flecainide (TAMBOCOR) 50 MG tablet   2. How are you currently taking this medication (dosage and times per day)? TAKE 1 TABLET(50 MG) BY MOUTH TWICE DAILY   3. Are you having a reaction (difficulty breathing--STAT)? No  4. What is your medication issue? Patient is calling because when she picked up the medication yesterday. She was only able to get enough for 15 days. Patient is scheduled to see our PA Otilio Saber on 09/08/23 and would like to know if we could send enough to last until 09/08/23. Please advise.

## 2023-08-06 NOTE — Telephone Encounter (Signed)
Spoke with Pt. Called in 45 more days of flecainide. Pt stated thanks.

## 2023-08-08 ENCOUNTER — Other Ambulatory Visit: Payer: Self-pay | Admitting: Internal Medicine

## 2023-09-05 NOTE — Progress Notes (Unsigned)
  Electrophysiology Office Note:   Date:  09/08/2023  ID:  [name] 01-28-1956, MRN [id number]  Primary Cardiologist: Donato Schultz, MD Electrophysiologist: Lewayne Bunting, MD      History of Present Illness:   [name] is a 67 y.o. female with h/o SVT, atrial tachycardia, and h/o fatigue seen today for routine electrophysiology followup.   Since last being seen in our clinic the patient reports doing well. Denies breakthrough arrhyhtmia. Overall, she denies chest pain, palpitations, dyspnea, PND, orthopnea, nausea, vomiting, dizziness, syncope, edema, weight gain, or early satiety.   Review of systems complete and found to be negative unless listed in HPI.   EP Information / Studies Reviewed:    EKG is ordered today. Personal review as below.  EKG Interpretation Date/Time:  Monday September 08 2023 09:25:05 EST Ventricular Rate:  68 PR Interval:  136 QRS Duration:  100 QT Interval:  400 QTC Calculation: 425 R Axis:   67  Text Interpretation: Normal sinus rhythm Nonspecific ST abnormality When compared with ECG of 25-Feb-2020 09:42, No significant change was found Confirmed by Maxine Glenn [phone]) on 09/08/2023 9:33:48 AM    Physical Exam:   VS:  BP 132/78   Pulse 68   Ht 5' 1.5" (1.562 m)   Wt 138 lb 12.8 oz (63 kg)   SpO2 97%   BMI 25.80 kg/m    Wt Readings from Last 3 Encounters:  09/08/23 138 lb 12.8 oz (63 kg)  07/22/23 137 lb 6.4 oz (62.3 kg)  01/22/23 138 lb 12.8 oz (63 kg)     GEN: Well nourished, well developed in no acute distress NECK: No JVD; No carotid bruits CARDIAC: Regular rate and rhythm, no murmurs, rubs, gallops RESPIRATORY:  Clear to auscultation without rales, wheezing or rhonchi  ABDOMEN: Soft, non-tender, non-distended EXTREMITIES:  No edema; No deformity   ASSESSMENT AND PLAN:    SVT Atrial tachycardia EKG today shows NSR with stable intervals Continue flecainide 50 mg BID  HLD Continue statin Calcium score of 11  02/2023 Stopped zetia with joint pains   Follow up with EP APP in 12 months (EKGs every 6 months concurrent with her gen cards visits)  Signed, Graciella Freer, PA-C

## 2023-09-08 ENCOUNTER — Encounter: Payer: Self-pay | Admitting: Student

## 2023-09-08 ENCOUNTER — Ambulatory Visit: Payer: Medicare Other | Attending: Student | Admitting: Student

## 2023-09-08 VITALS — BP 132/78 | HR 68 | Ht 61.5 in | Wt 138.8 lb

## 2023-09-08 DIAGNOSIS — I471 Supraventricular tachycardia, unspecified: Secondary | ICD-10-CM | POA: Diagnosis not present

## 2023-09-08 DIAGNOSIS — E7800 Pure hypercholesterolemia, unspecified: Secondary | ICD-10-CM | POA: Diagnosis not present

## 2023-09-08 NOTE — Patient Instructions (Signed)
Medication Instructions:  Your physician recommends that you continue on your current medications as directed. Please refer to the Current Medication list given to you today.  *If you need a refill on your cardiac medications before your next appointment, please call your pharmacy*  Lab Work: If you have labs (blood work) drawn today and your tests are completely normal, you will receive your results only by: MyChart Message (if you have MyChart) OR A paper copy in the mail If you have any lab test that is abnormal or we need to change your treatment, we will call you to review the results.  Testing/Procedures: None ordered today.  Follow-Up: At [facility], you and your health needs are our priority.  As part of our continuing mission to provide you with exceptional heart care, we have created designated Provider Care Teams.  These Care Teams include your primary Cardiologist (physician) and Advanced Practice Providers (APPs -  Physician Assistants and Nurse Practitioners) who all work together to provide you with the care you need, when you need it.  We recommend signing up for the patient portal called "MyChart".  Sign up information is provided on this After Visit Summary.  MyChart is used to connect with patients for Virtual Visits (Telemedicine).  Patients are able to view lab/test results, encounter notes, upcoming appointments, etc.  Non-urgent messages can be sent to your provider as well.   To learn more about what you can do with MyChart, go to [email or web address].    Your next appointment:   1 year(s)  Provider:   You will see one of the following Advanced Practice Providers on your designated Care Team:   Casimiro Needle "Mardelle Matte" [place], [place]

## 2023-09-22 DIAGNOSIS — K080 Exfoliation of teeth due to systemic causes: Secondary | ICD-10-CM | POA: Diagnosis not present

## 2023-09-29 ENCOUNTER — Other Ambulatory Visit: Payer: Self-pay | Admitting: Internal Medicine

## 2023-10-03 ENCOUNTER — Ambulatory Visit: Payer: Medicare Other

## 2023-10-20 ENCOUNTER — Encounter: Payer: Medicare Other | Admitting: Family Medicine

## 2023-12-01 DIAGNOSIS — K080 Exfoliation of teeth due to systemic causes: Secondary | ICD-10-CM | POA: Diagnosis not present

## 2023-12-22 ENCOUNTER — Telehealth: Payer: Self-pay

## 2023-12-22 NOTE — Telephone Encounter (Signed)
Noted and appt is scheduled

## 2023-12-22 NOTE — Telephone Encounter (Signed)
 Copied from CRM [id number]. Topic: Appointments - Transfer of Care >> Dec 22, 2023 12:55 PM Luane Rumps D wrote: Pt is requesting to transfer FROM: Catheryn Cluck, MD Pt is requesting to transfer TO: Gavin Kast, FNP-C Reason for requested transfer: Pt prefers Odilia Bennett It is the responsibility of the team the patient would like to transfer to (Dr. Hildy Lowers, Lea Primmer, MD) to reach out to the patient if for any reason this transfer is not acceptable.

## 2023-12-22 NOTE — Telephone Encounter (Signed)
 Forwarding message below

## 2023-12-25 NOTE — Telephone Encounter (Signed)
 That is fine with me.

## 2024-01-27 DIAGNOSIS — L578 Other skin changes due to chronic exposure to nonionizing radiation: Secondary | ICD-10-CM | POA: Diagnosis not present

## 2024-01-27 DIAGNOSIS — D225 Melanocytic nevi of trunk: Secondary | ICD-10-CM | POA: Diagnosis not present

## 2024-01-27 DIAGNOSIS — L719 Rosacea, unspecified: Secondary | ICD-10-CM | POA: Diagnosis not present

## 2024-01-27 DIAGNOSIS — L821 Other seborrheic keratosis: Secondary | ICD-10-CM | POA: Diagnosis not present

## 2024-02-09 ENCOUNTER — Ambulatory Visit (INDEPENDENT_AMBULATORY_CARE_PROVIDER_SITE_OTHER): Admitting: Internal Medicine

## 2024-02-09 ENCOUNTER — Encounter: Admitting: Family Medicine

## 2024-02-09 VITALS — BP 148/79 | HR 73 | Temp 98.0°F | Ht 61.5 in | Wt 141.8 lb

## 2024-02-09 DIAGNOSIS — I471 Supraventricular tachycardia, unspecified: Secondary | ICD-10-CM | POA: Diagnosis not present

## 2024-02-09 DIAGNOSIS — Z860100 Personal history of colon polyps, unspecified: Secondary | ICD-10-CM

## 2024-02-09 DIAGNOSIS — R5383 Other fatigue: Secondary | ICD-10-CM | POA: Diagnosis not present

## 2024-02-09 DIAGNOSIS — Z90722 Acquired absence of ovaries, bilateral: Secondary | ICD-10-CM

## 2024-02-09 DIAGNOSIS — E7800 Pure hypercholesterolemia, unspecified: Secondary | ICD-10-CM | POA: Diagnosis not present

## 2024-02-09 DIAGNOSIS — Z9079 Acquired absence of other genital organ(s): Secondary | ICD-10-CM

## 2024-02-09 DIAGNOSIS — Z90710 Acquired absence of both cervix and uterus: Secondary | ICD-10-CM

## 2024-02-09 LAB — CBC WITH DIFFERENTIAL/PLATELET
Basophils Absolute: 0 10*3/uL (ref 0.0–0.1)
Basophils Relative: 0.6 % (ref 0.0–3.0)
Eosinophils Absolute: 0.2 10*3/uL (ref 0.0–0.7)
Eosinophils Relative: 3.2 % (ref 0.0–5.0)
HCT: 43.2 % (ref 36.0–46.0)
Hemoglobin: 14.3 g/dL (ref 12.0–15.0)
Lymphocytes Relative: 31.2 % (ref 12.0–46.0)
Lymphs Abs: 1.8 10*3/uL (ref 0.7–4.0)
MCHC: 33.1 g/dL (ref 30.0–36.0)
MCV: 92.4 fl (ref 78.0–100.0)
Monocytes Absolute: 0.5 10*3/uL (ref 0.1–1.0)
Monocytes Relative: 7.9 % (ref 3.0–12.0)
Neutro Abs: 3.3 10*3/uL (ref 1.4–7.7)
Neutrophils Relative %: 57.1 % (ref 43.0–77.0)
Platelets: 256 10*3/uL (ref 150.0–400.0)
RBC: 4.67 Mil/uL (ref 3.87–5.11)
RDW: 13.6 % (ref 11.5–15.5)
WBC: 5.8 10*3/uL (ref 4.0–10.5)

## 2024-02-09 LAB — BASIC METABOLIC PANEL WITH GFR
BUN: 13 mg/dL (ref 6–23)
CO2: 25 meq/L (ref 19–32)
Calcium: 9.3 mg/dL (ref 8.4–10.5)
Chloride: 105 meq/L (ref 96–112)
Creatinine, Ser: 0.61 mg/dL (ref 0.40–1.20)
GFR: 92.82 mL/min (ref >60.00)
Glucose, Bld: 91 mg/dL (ref 70–99)
Potassium: 3.8 meq/L (ref 3.5–5.1)
Sodium: 140 meq/L (ref 135–145)

## 2024-02-09 LAB — VITAMIN D 25 HYDROXY (VIT D DEFICIENCY, FRACTURES): VITD: 40.28 ng/mL (ref 30.00–100.00)

## 2024-02-09 LAB — VITAMIN B12: Vitamin B-12: 1039 pg/mL — ABNORMAL HIGH (ref 211–911)

## 2024-02-09 NOTE — Progress Notes (Signed)
 [facility] [address] [place] [address] Dept: [phone] Dept Fax: [phone]    Subjective:   [name] 10-Oct-1955 02/09/2024  Chief Complaint  Patient presents with   Transfer of Care   Fatigue    Over the last month   Hair/Scalp Problem    Hair intermittently thinning about every 6 months   taste and smell    Reports trouble smelling and tasting. Covid 2020.    HPI:  Discussed the use of AI scribe software for clinical note transcription with the patient, who gave verbal consent to proceed.  History of Present Illness   [name] is a 68 year old female who presents with fatigue and hair changes.  She has been experiencing significant fatigue since April 2025. She feels exhausted and as though she is 'dragging' through daily activities, which is not her normal energy level. Despite maintaining her usual level of physical activity, including gardening and household chores, she feels as though she is moving in 'slow motion'.  She notes a recent weight gain from 135 to 141 pounds over a span of two to three weeks, despite being physically active. She also reports changes in her hair, which has become very thin at times, then cycles to being very thick.   Her current medications include flecainide  50 mg twice daily for SVT, rosuvastatin  5 mg every other day for high cholesterol, and baby aspirin every other day. She also takes several supplements, including vitamin D , biotin, folic acid, B12, and magnesium complex, though she is sporadic with her vitamin intake.  No recent viral illnesses, including COVID-19, and no chest pain, palpitations, lightheadedness, or gastrointestinal symptoms such as vomiting or blood in stool. She reports occasional constipation and has a history of testing positive for rheumatoid arthritis without inflammation.  She sleeps alone and has occasionally woken herself up with a  snore and experienced dry mouth upon waking. She feels she has not been sleeping as well in the past two weeks, despite previously feeling well-rested.   History of colon polyps, last colonoscopy in 2022, due 2032. History of total hysterectomy and bilateral salping oophorectomy.      Wt Readings from Last 3 Encounters:  02/09/24 141 lb 12.8 oz (64.3 kg)  09/08/23 138 lb 12.8 oz (63 kg)  07/22/23 137 lb 6.4 oz (62.3 kg)       The following portions of the patient's history were reviewed and updated as appropriate: past medical history, past surgical history, family history, social history, allergies, medications, and problem list.   Patient Active Problem List   Diagnosis Date Noted   Mitral regurgitation 03/26/2022   Aortic atherosclerosis (HCC) 09/19/2021   SVT (supraventricular tachycardia) (HCC) 01/11/2020   Pure hypercholesterolemia 04/09/2010   History of colonic polyps 04/09/2010   Past Medical History:  Diagnosis Date   Anxiety    Aortic atherosclerosis (HCC) 09/19/2021   Coronary calcium  score-0 in 2019, mild aortic atherosclerosis noted   Depression    History of CT scan    Coronary calcium  CT 3/19: Calcium  score 0   HTN (hypertension) 05/21/2011   Hypercholesterolemia 04/09/2010   Qualifier: Diagnosis of  By: Bevin Bucks CMA, Kelly     Hyperlipidemia    Menopause 03/11/2012   Mitral regurgitation    Echocardiogram 03/2022: EF 60-65, GLS -22, normal RVSF, normal PASP, mild MR, mild to mod TR, mild AI   Palpitations 07/08/2017   Polyp of colon 2009   benign  PVC's (premature ventricular contractions)    a. prior event monitor 02/2014: NSR, rare PVC.   SAB (spontaneous abortion)    Stress    situational   SVT (supraventricular tachycardia) (HCC) 01/11/2020   SVT (supraventricular tachycardia) (HCC) 01/11/2020   Atrial tachycardia/SVT/average 200 bpm on monitor 12/2019-flecanide, Dr. 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    Past Surgical History:  Procedure Laterality Date   CARPAL TUNNEL RELEASE Right 2004   CESAREAN SECTION     x 2 ( 1 with trisomy 18)   ELECTROPHYSIOLOGY STUDY N/A 02/25/2020   Procedure: ELECTROPHYSIOLOGY STUDY;  Surgeon: Tammie Fall, MD;  Location: [facility] INVASIVE [facility];  Service: Cardiovascular;  Laterality: N/A;   MYOMECTOMY     SVT ABLATION N/A 02/25/2020   Procedure: SVT ABLATION;  Surgeon: Tammie Fall, MD;  Location: [facility] INVASIVE CV LAB;  Service: Cardiovascular;  Laterality: N/A;   TOTAL ABDOMINAL HYSTERECTOMY W/ BILATERAL SALPINGOOPHORECTOMY  03/04/2006   Family History  Problem Relation Age of Onset   Hypertension Mother    Heart attack Mother 76   Hypertension Father    Atrial fibrillation Father    Lung cancer Father        LUNG   Heart failure Father    Stroke Neg Hx    Breast cancer Neg Hx     Current Outpatient Medications:    aspirin 81 MG tablet, Take 81 mg by mouth every other day., Disp: , Rfl:    Biotin 5000 MCG TABS, Take 10,000 mcg by mouth daily. , Disp: , Rfl:    Cyanocobalamin (VITAMIN B 12 PO), Take 1,000 mcg by mouth daily. , Disp: , Rfl:    flecainide  (TAMBOCOR ) 50 MG tablet, TAKE 1 TABLET(50 MG) BY MOUTH TWICE DAILY, Disp: 180 tablet, Rfl: 3   folic acid (FOLVITE) 400 MCG tablet, Take 400 mcg by mouth 2 (two) times a day. , Disp: , Rfl:    Magnesium 250 MG TABS, Take 250 mg by mouth daily., Disp: , Rfl:    rosuvastatin  (CRESTOR ) 5 MG tablet, Take 1 tablet (5 mg total) by mouth every other day., Disp: 45 tablet, Rfl: 3   Vitamin D , Cholecalciferol, 25 MCG (1000 UT) TABS, Take 1,000 Units by mouth daily. , Disp: , Rfl:  Allergies  Allergen Reactions   Amoxicillin     SKIN BURNING SENSATION   Codeine     nausea   Morphine And Codeine Nausea And Vomiting   Megace [Megestrol] Rash     ROS: A complete ROS was performed with pertinent positives/negatives noted in the HPI. The remainder of the ROS are negative.    Objective:   Today's  Vitals   02/09/24 0946  BP: (!) 148/79  Pulse: 73  Temp: 98 F (36.7 C)  TempSrc: Temporal  SpO2: 98%  Weight: 141 lb 12.8 oz (64.3 kg)  Height: 5' 1.5" (1.562 m)    GENERAL: Well-appearing, in NAD. Well nourished.  SKIN: Pink, warm and dry. No rash, lesion, ulceration, or ecchymoses.  NECK: Trachea midline. Full ROM w/o pain or tenderness. No lymphadenopathy. No thyromegaly or palpable masses.  RESPIRATORY: Chest wall symmetrical. Respirations even and non-labored. Breath sounds clear to auscultation bilaterally.  CARDIAC: S1, S2 present, regular rate and rhythm. Peripheral pulses 2+ bilaterally.  EXTREMITIES: Without clubbing, cyanosis, or edema.  NEUROLOGIC: No motor or sensory deficits. Steady, even gait.  PSYCH/MENTAL STATUS: Alert, oriented x 3. Cooperative, appropriate mood and  affect.   Health Maintenance Due  Topic Date Due   Medicare Annual Wellness (AWV)  Never done   Hepatitis C Screening  Never done   Zoster Vaccines- Shingrix (1 of 2) Never done   Cervical Cancer Screening (Pap smear)  10/25/2010   COVID-19 Vaccine (3 - Pfizer risk series) 02/09/2020    Results for orders placed or performed in visit on 02/09/24  Vitamin B12  Result Value Ref Range   Vitamin B-12 1,039 (H) 211 - 911 pg/mL  VITAMIN D  25 Hydroxy (Vit-D Deficiency, Fractures)  Result Value Ref Range   VITD 40.28 30.00 - 100.00 ng/mL  CBC with Differential/Platelet  Result Value Ref Range   WBC 5.8 4.0 - 10.5 K/uL   RBC 4.67 3.87 - 5.11 Mil/uL   Hemoglobin 14.3 12.0 - 15.0 g/dL   HCT 16.1 09.6 - 04.5 %   MCV 92.4 78.0 - 100.0 fl   MCHC 33.1 30.0 - 36.0 g/dL   RDW 40.9 81.1 - 91.4 %   Platelets 256.0 150.0 - 400.0 K/uL   Neutrophils Relative % 57.1 43.0 - 77.0 %   Lymphocytes Relative 31.2 12.0 - 46.0 %   Monocytes Relative 7.9 3.0 - 12.0 %   Eosinophils Relative 3.2 0.0 - 5.0 %   Basophils Relative 0.6 0.0 - 3.0 %   Neutro Abs 3.3 1.4 - 7.7 K/uL   Lymphs Abs 1.8 0.7 - 4.0 K/uL    Monocytes Absolute 0.5 0.1 - 1.0 K/uL   Eosinophils Absolute 0.2 0.0 - 0.7 K/uL   Basophils Absolute 0.0 0.0 - 0.1 K/uL  Basic Metabolic Panel (BMET)  Result Value Ref Range   Sodium 140 135 - 145 mEq/L   Potassium 3.8 3.5 - 5.1 mEq/L   Chloride 105 96 - 112 mEq/L   CO2 25 19 - 32 mEq/L   Glucose, Bld 91 70 - 99 mg/dL   BUN 13 6 - 23 mg/dL   Creatinine, Ser 7.82 0.40 - 1.20 mg/dL   GFR 95.62 >13.08 mL/min   Calcium  9.3 8.4 - 10.5 mg/dL    The 65-HQIO ASCVD risk score (Arnett DK, et al., 2019) is: 7.7%     Assessment & Plan:  Assessment and Plan    Fatigue Persistent fatigue since April. Differential includes thyroid  dysfunction, vitamin D  or B12 deficiency, and sleep apnea. Hair thinning and weight changes may relate to thyroid  or hormonal changes post-hysterectomy. Biotin intake may affect thyroid  tests. - Order blood tests: electrolytes, kidney function, thyroid  panel (TSH, free T4, T3), vitamin D , B12, hemoglobin. - Consider sleep study if labs normal.  Supraventricular tachycardia (SVT) SVT well-managed with flecainide  50 mg twice daily. No recent episodes reported.  Hyperlipidemia Managed with rosuvastatin  5 mg every other day.  Total hysterectomy with bilateral salpingo-oophorectomy Possible hormonal changes contributing to fatigue and hair thinning.  History of Colon Polyps Last colonoscopy in 2022 was normal. Due 2032.    Orders Placed This Encounter  Procedures   Thyroid  Panel With TSH   Vitamin B12   VITAMIN D  25 Hydroxy (Vit-D Deficiency, Fractures)   CBC with Differential/Platelet   Basic Metabolic Panel (BMET)   No images are attached to the encounter or orders placed in the encounter. No orders of the defined types were placed in this encounter.   Return in about 3 months (around 05/11/2024) for Annual Physical Exam with fasting lab work.   Gavin Kast, FNP

## 2024-02-09 NOTE — Patient Instructions (Signed)
 Be sure to schedule your Annual Wellness Visit at the front desk when you check-out.

## 2024-02-10 LAB — THYROID PANEL WITH TSH
Free Thyroxine Index: 1.9 (ref 1.4–3.8)
T3 Uptake: 32 % (ref 22–35)
T4, Total: 6 ug/dL (ref 5.1–11.9)
TSH: 3.67 m[IU]/L (ref 0.40–4.50)

## 2024-02-11 ENCOUNTER — Ambulatory Visit: Payer: Self-pay | Admitting: Internal Medicine

## 2024-05-03 ENCOUNTER — Encounter: Payer: Self-pay | Admitting: Cardiology

## 2024-05-03 ENCOUNTER — Ambulatory Visit: Attending: Cardiology | Admitting: Cardiology

## 2024-05-03 VITALS — BP 142/86 | HR 68 | Ht 61.0 in | Wt 137.4 lb

## 2024-05-03 DIAGNOSIS — Z79899 Other long term (current) drug therapy: Secondary | ICD-10-CM | POA: Diagnosis not present

## 2024-05-03 DIAGNOSIS — I471 Supraventricular tachycardia, unspecified: Secondary | ICD-10-CM

## 2024-05-03 DIAGNOSIS — Z789 Other specified health status: Secondary | ICD-10-CM | POA: Diagnosis not present

## 2024-05-03 DIAGNOSIS — I700 Atherosclerosis of aorta: Secondary | ICD-10-CM

## 2024-05-03 DIAGNOSIS — E7800 Pure hypercholesterolemia, unspecified: Secondary | ICD-10-CM

## 2024-05-03 NOTE — Patient Instructions (Signed)

## 2024-05-03 NOTE — Progress Notes (Signed)
 Cardiology Office Note:  .   Date:  05/03/2024  ID:  [name], DOB [birth year], MRN [id number] PCP: Billy Knee, FNP  [facility] Providers Cardiologist:  Oneil Parchment, MD Cardiology APP:  Army Katheryn BROCKS, NP (Inactive)  Electrophysiologist:  Danelle Birmingham, MD     History of Present Illness: .   [name] is a 68 y.o. female Discussed the use of AI scribe software for clinical note transcription with the patient, who gave verbal consent to proceed.  History of Present Illness [name] is a 68 year old female with supraventricular tachycardia and atrial tachycardia who presents for follow-up.  She feels well and reports no breakthrough arrhythmias or new palpitations while taking flecainide  50 mg twice a day. No breakthrough arrhythmias or new palpitations. No new symptoms related to her tachycardia.  She has a history of elevated cholesterol and was previously on ezetimibe , which she discontinued due to joint pain. She continues to take rosuvastatin  5 mg every other day and tolerates it well with only occasional joint aches. Her LDL was 133 last year, and her HDL is 70. She also takes low-dose aspirin every other day.  She experienced an incident where she felt nauseated and as if she was going to pass out while at a restaurant, but the symptoms resolved quickly with the application of ice. Sounds vagal.   Her father has a history of atrial fibrillation, and her mother has hypertension.  She is actively trying to improve her diet and exercise routine, including walking and lifting weights, and is tracking her food intake.    Studies Reviewed: SABRA   EKG Interpretation Date/Time:  Monday May 03 2024 09:09:23 EDT Ventricular Rate:  68 PR Interval:  142 QRS Duration:  98 QT Interval:  414 QTC Calculation: 440 R Axis:   23  Text Interpretation: Normal sinus rhythm Cannot rule out Anterior infarct , age undetermined When compared with ECG of 08-Sep-2023 09:25,  No significant change was found Confirmed by Parchment Oneil ([id number]) on 05/03/2024 9:13:28 AM    Results LABS LDL: 133 (2024) Creatinine: 0.6 (2024) HDL: 70 (2024)  RADIOLOGY Calcium  score: 11 (2024)  DIAGNOSTIC EKG: Normal (05/03/2024) Risk Assessment/Calculations:           Physical Exam:   VS:  BP (!) 142/86 (BP Location: Left Arm, Patient Position: Sitting)   Pulse 68   Ht 5' 1 (1.549 m)   Wt 137 lb 6.4 oz (62.3 kg)   SpO2 99%   BMI 25.96 kg/m    Wt Readings from Last 3 Encounters:  05/03/24 137 lb 6.4 oz (62.3 kg)  02/09/24 141 lb 12.8 oz (64.3 kg)  09/08/23 138 lb 12.8 oz (63 kg)    GEN: Well nourished, well developed in no acute distress NECK: No JVD; No carotid bruits CARDIAC: RRR, no murmurs, no rubs, no gallops RESPIRATORY:  Clear to auscultation without rales, wheezing or rhonchi  ABDOMEN: Soft, non-tender, non-distended EXTREMITIES:  No edema; No deformity   ASSESSMENT AND PLAN: .    Assessment and Plan Assessment & Plan Supraventricular tachycardia and atrial tachycardia, stable on flecainide  SVT and atrial tachycardia are well-controlled with flecainide  50 mg twice daily. No breakthrough arrhythmias or new palpitations reported. EKG shows normal results. Low dose of flecainide  is effective and well-tolerated. - Continue flecainide  50 mg orally twice daily - ECG stable.  - Maintain current lifestyle modifications including diet and exercise - Schedule follow-up in one year unless symptoms change  Aortic  atherosclerosis No new symptoms reported. LDL was 133 last year, and HDL is 70.  Creatinine is 0.6, indicating good kidney function. - Continue rosuvastatin  5 mg orally every other day, unable to tolerate higher dose. Not interested in additional cholesterol medication.  - Maintain healthy diet and exercise regimen  Hypercholesterolemia with statin intolerance/ aortic atherosclerosis Hypercholesterolemia is managed with rosuvastatin  5 mg every other day  due to previous statin intolerance. Joint pain was experienced with daily dosing, but current regimen is tolerated with minimal issues. She declined additional cholesterol medications due to concerns about adverse reactions. - Continue rosuvastatin  5 mg orally every other day - Monitor cholesterol levels with primary care physician         Dispo: 1 yr  Signed, Oneil Parchment, MD

## 2024-05-17 ENCOUNTER — Encounter: Payer: Self-pay | Admitting: Internal Medicine

## 2024-05-17 ENCOUNTER — Ambulatory Visit: Admitting: Internal Medicine

## 2024-05-17 VITALS — BP 134/72 | HR 78 | Temp 98.2°F | Ht 61.0 in | Wt 136.6 lb

## 2024-05-17 DIAGNOSIS — R5383 Other fatigue: Secondary | ICD-10-CM | POA: Diagnosis not present

## 2024-05-17 DIAGNOSIS — E538 Deficiency of other specified B group vitamins: Secondary | ICD-10-CM | POA: Diagnosis not present

## 2024-05-17 DIAGNOSIS — Z0000 Encounter for general adult medical examination without abnormal findings: Secondary | ICD-10-CM

## 2024-05-17 DIAGNOSIS — Z1231 Encounter for screening mammogram for malignant neoplasm of breast: Secondary | ICD-10-CM | POA: Diagnosis not present

## 2024-05-17 LAB — CBC WITH DIFFERENTIAL/PLATELET
Basophils Absolute: 0 K/uL (ref 0.0–0.1)
Basophils Relative: 0.7 % (ref 0.0–3.0)
Eosinophils Absolute: 0.1 K/uL (ref 0.0–0.7)
Eosinophils Relative: 2.2 % (ref 0.0–5.0)
HCT: 41.4 % (ref 36.0–46.0)
Hemoglobin: 14 g/dL (ref 12.0–15.0)
Lymphocytes Relative: 27.8 % (ref 12.0–46.0)
Lymphs Abs: 1.6 K/uL (ref 0.7–4.0)
MCHC: 33.8 g/dL (ref 30.0–36.0)
MCV: 92.1 fl (ref 78.0–100.0)
Monocytes Absolute: 0.4 K/uL (ref 0.1–1.0)
Monocytes Relative: 7.3 % (ref 3.0–12.0)
Neutro Abs: 3.6 K/uL (ref 1.4–7.7)
Neutrophils Relative %: 62 % (ref 43.0–77.0)
Platelets: 252 K/uL (ref 150.0–400.0)
RBC: 4.49 Mil/uL (ref 3.87–5.11)
RDW: 13.2 % (ref 11.5–15.5)
WBC: 5.8 K/uL (ref 4.0–10.5)

## 2024-05-17 LAB — COMPREHENSIVE METABOLIC PANEL WITH GFR
ALT: 18 U/L (ref 0–35)
AST: 18 U/L (ref 0–37)
Albumin: 4.1 g/dL (ref 3.5–5.2)
Alkaline Phosphatase: 87 U/L (ref 39–117)
BUN: 12 mg/dL (ref 6–23)
CO2: 31 meq/L (ref 19–32)
Calcium: 9.4 mg/dL (ref 8.4–10.5)
Chloride: 100 meq/L (ref 96–112)
Creatinine, Ser: 0.63 mg/dL (ref 0.40–1.20)
GFR: 91.93 mL/min (ref >60.00)
Glucose, Bld: 88 mg/dL (ref 70–99)
Potassium: 4.1 meq/L (ref 3.5–5.1)
Sodium: 136 meq/L (ref 135–145)
Total Bilirubin: 0.5 mg/dL (ref 0.2–1.2)
Total Protein: 6.5 g/dL (ref 6.0–8.3)

## 2024-05-17 LAB — LIPID PANEL
Cholesterol: 205 mg/dL — ABNORMAL HIGH (ref 0–200)
HDL: 58 mg/dL (ref >39.00)
LDL Cholesterol: 118 mg/dL — ABNORMAL HIGH (ref 0–99)
NonHDL: 147.11
Total CHOL/HDL Ratio: 4
Triglycerides: 146 mg/dL (ref 0.0–149.0)
VLDL: 29.2 mg/dL (ref 0.0–40.0)

## 2024-05-17 LAB — VITAMIN B12: Vitamin B-12: 583 pg/mL (ref 211–911)

## 2024-05-17 LAB — TSH: TSH: 3.52 u[IU]/mL (ref 0.35–5.50)

## 2024-05-17 LAB — VITAMIN D 25 HYDROXY (VIT D DEFICIENCY, FRACTURES): VITD: 41.26 ng/mL (ref 30.00–100.00)

## 2024-05-17 NOTE — Progress Notes (Signed)
 Subjective:   [name] 1956/04/09  05/17/2024   CC: Chief Complaint  Patient presents with   Annual Exam    Pt is fasting, no concerns mammogram needed     HPI: [name] is a 68 y.o. female who presents for a routine health maintenance exam.  Labs collected at time of visit. Patient requests vit b12 and d to be rechecked due to previous low b12 and fatigue. Fatigue has resolved.    HEALTH SCREENINGS: - Pap smear: total hysterectomy  - Mammogram (40+): Ordered today  - Colonoscopy (45+): Up to date  - Bone Density (65+): Up to date  - Lung CA screening with low-dose CT:  Not applicable Adults age 36-80 who are current cigarette smokers or quit within the last 15 years. Must have 20 pack year history.   IMMUNIZATIONS: - Tdap: Tetanus vaccination status reviewed: last tetanus booster within 10 years. - HPV: Not applicable - Influenza: Postponed to flu season - Prevnar 20: declined at this time - Zostavax (50+): discussed with patient. Declined at this time.    Past medical history, surgical history, medications, allergies, family history and social history reviewed with patient today and changes made to appropriate areas of the chart.   Social History   Socioeconomic History   Marital status: Widowed    Spouse name: Not on file   Number of children: Not on file   Years of education: Not on file   Highest education level: Associate degree: occupational, Scientist, product/process development, or vocational program  Occupational History   Not on file  Tobacco Use   Smoking status: Never    Passive exposure: Never   Smokeless tobacco: Never  Vaping Use   Vaping status: Never Used  Substance and Sexual Activity   Alcohol  use: No   Drug use: No   Sexual activity: Never  Other Topics Concern   Not on file  Social History Narrative   Not on file   Social Drivers of Health   Financial Resource Strain: Low Risk  (05/16/2024)   Overall Financial Resource Strain (CARDIA)    Difficulty  of Paying Living Expenses: Not hard at all  Food Insecurity: No Food Insecurity (05/16/2024)   Hunger Vital Sign    Worried About Running Out of Food in the Last Year: Never true    Ran Out of Food in the Last Year: Never true  Transportation Needs: No Transportation Needs (05/16/2024)   PRAPARE - Administrator, Civil Service (Medical): No    Lack of Transportation (Non-Medical): No  Physical Activity: Sufficiently Active (05/16/2024)   Exercise Vital Sign    Days of Exercise per Week: 4 days    Minutes of Exercise per Session: 40 min  Stress: No Stress Concern Present (05/16/2024)   [employer] of Occupational Health - Occupational Stress Questionnaire    Feeling of Stress: Not at all  Social Connections: Moderately Integrated (05/16/2024)   Social Connection and Isolation Panel    Frequency of Communication with Friends and Family: More than three times a week    Frequency of Social Gatherings with Friends and Family: More than three times a week    Attends Religious Services: More than 4 times per year    Active Member of [employer] or Organizations: Yes    Attends Banker Meetings: More than 4 times per year    Marital Status: Widowed  Intimate Partner Violence: Not on file     Past Medical History:  Diagnosis Date   Anxiety    Aortic atherosclerosis (HCC) 09/19/2021   Coronary calcium  score-0 in 2019, mild aortic atherosclerosis noted   Depression    History of CT scan    Coronary calcium  CT 3/19: Calcium  score 0   HTN (hypertension) 05/21/2011   Hypercholesterolemia 04/09/2010   Qualifier: Diagnosis of  By: Delores CMA, Kelly     Hyperlipidemia    Menopause 03/11/2012   Mitral regurgitation    Echocardiogram 03/2022: EF 60-65, GLS -22, normal RVSF, normal PASP, mild MR, mild to mod TR, mild AI   Palpitations 07/08/2017   Polyp of colon 2009   benign   PVC's (premature ventricular contractions)    a. prior event monitor 02/2014: NSR, rare PVC.   SAB  (spontaneous abortion)    Stress    situational   SVT (supraventricular tachycardia) (HCC) 01/11/2020   SVT (supraventricular tachycardia) (HCC) 01/11/2020   Atrial tachycardia/SVT/average 200 bpm on monitor 12/2019-flecanide, Dr. Waddell   Tiredness 03/11/2012   Trisomy 18    prior pregnancy with Trisomy 18    Past Surgical History:  Procedure Laterality Date   CARPAL TUNNEL RELEASE Right 2004   CESAREAN SECTION     x 2 ( 1 with trisomy 18)   ELECTROPHYSIOLOGY STUDY N/A 02/25/2020   Procedure: ELECTROPHYSIOLOGY STUDY;  Surgeon: Waddell Danelle ORN, MD;  Location: [facility];  Service: Cardiovascular;  Laterality: N/A;   MYOMECTOMY     SVT ABLATION N/A 02/25/2020   Procedure: SVT ABLATION;  Surgeon: Waddell Danelle ORN, MD;  Location: [facility];  Service: Cardiovascular;  Laterality: N/A;   TOTAL ABDOMINAL HYSTERECTOMY W/ BILATERAL SALPINGOOPHORECTOMY  03/04/2006    Current Outpatient Medications on File Prior to Visit  Medication Sig   aspirin 81 MG tablet Take 81 mg by mouth every other day.   flecainide  (TAMBOCOR ) 50 MG tablet TAKE 1 TABLET(50 MG) BY MOUTH TWICE DAILY   Magnesium 250 MG TABS Take 250 mg by mouth daily.   rosuvastatin  (CRESTOR ) 5 MG tablet Take 1 tablet (5 mg total) by mouth every other day.   Biotin 5000 MCG TABS Take 10,000 mcg by mouth daily.  (Patient not taking: Reported on 05/17/2024)   Cyanocobalamin  (VITAMIN B 12 PO) Take 1,000 mcg by mouth daily.    folic acid (FOLVITE) 400 MCG tablet Take 400 mcg by mouth 2 (two) times a day.    Vitamin D , Cholecalciferol, 25 MCG (1000 UT) TABS Take 1,000 Units by mouth daily.  (Patient not taking: Reported on 05/17/2024)   No current facility-administered medications on file prior to visit.    Allergies  Allergen Reactions   Amoxicillin     SKIN BURNING SENSATION   Codeine     nausea   Morphine And Codeine Nausea And Vomiting   Megace [Megestrol] Rash    Family History  Problem Relation Age of Onset    Hypertension Mother    Heart attack Mother 68   Hypertension Father    Atrial fibrillation Father    Lung cancer Father        LUNG   Heart failure Father    Stroke Neg Hx    Breast cancer Neg Hx      ROS: Denies fever, fatigue, unexplained weight loss/gain, hearing or vision changes, cardiac or respiratory complaints. Denies neurological deficits, musculoskeletal complaints, gastrointestinal or genitourinary complaints, mental health complaints, and skin changes.   Objective:   Today's Vitals   05/17/24 0943  BP: 134/72  Pulse:  78  Temp: 98.2 F (36.8 C)  TempSrc: Temporal  SpO2: 99%  Weight: 136 lb 9.6 oz (62 kg)  Height: 5' 1 (1.549 m)    GENERAL APPEARANCE: Well-appearing, in NAD. Well nourished.  SKIN: Pink, warm and dry. Turgor normal. No rash, lesion, ulceration, or ecchymoses. Hair evenly distributed.  HEENT: HEAD: Normocephalic.  EYES: PERRLA. EOMI. Lids intact w/o defect. Sclera white, Conjunctiva pink w/o exudate.  EARS: External ear w/o redness, swelling, masses or lesions. EAC clear. TM's intact, translucent w/o bulging, appropriate landmarks visualized. Appropriate acuity to conversational tones.  NOSE: Septum midline w/o deformity. Nares patent, mucosa pink and non-inflamed w/o drainage. No sinus tenderness.  THROAT: Uvula midline. Oropharynx clear. Tonsils non-inflamed w/o exudate . Oral mucosa pink and moist.  NECK: Supple, Trachea midline. Full ROM w/o pain or tenderness. No lymphadenopathy. Thyroid  non-tender w/o enlargement or palpable masses.  RESPIRATORY: Chest wall symmetrical w/o masses. Respirations even and non-labored. Breath sounds clear to auscultation bilaterally. No wheezes, rales, rhonchi, or crackles. CARDIAC: S1, S2 present, regular rate and rhythm. No gallops, murmurs, rubs, or clicks. No carotid bruits. Capillary refill <2 seconds. Peripheral pulses 2+ bilaterally. GI: Abdomen soft w/o distention. Normoactive bowel sounds. No palpable masses  or tenderness. No guarding or rebound tenderness. Liver and spleen w/o tenderness or enlargement. No CVA tenderness.  MSK: Muscle tone and strength appropriate for age, w/o atrophy or abnormal movement.  EXTREMITIES: Active ROM intact, w/o tenderness, crepitus, or contracture. No obvious joint deformities or effusions. No clubbing, edema, or cyanosis.  NEUROLOGIC: CN's II-XII intact. Motor strength symmetrical with no obvious weakness. No sensory deficits. Steady, even gait.  PSYCH/MENTAL STATUS: Alert, oriented x 3. Cooperative, appropriate mood and affect.   Depression and Anxiety Screen done today and results listed below:     05/17/2024    9:40 AM 02/09/2024    9:44 AM 07/22/2023    2:24 PM  Depression screen PHQ 2/9  Decreased Interest 0 0 0  Down, Depressed, Hopeless 0 0 0  PHQ - 2 Score 0 0 0  Altered sleeping 0  0  Tired, decreased energy 0  0  Change in appetite 0  0  Feeling bad or failure about yourself  0  0  Trouble concentrating 0  0  Moving slowly or fidgety/restless 0  0  Suicidal thoughts 0  0  PHQ-9 Score 0  0  Difficult doing work/chores Not difficult at all  Not difficult at all      05/17/2024    9:40 AM 07/22/2023    2:24 PM  GAD 7 : Generalized Anxiety Score  Nervous, Anxious, on Edge 0 0  Control/stop worrying 0 0  Worry too much - different things 0 0  Trouble relaxing 0 0  Restless 0 0  Easily annoyed or irritable 0 0  Afraid - awful might happen 0 0  Total GAD 7 Score 0 0  Anxiety Difficulty  Not difficult at all      Assessment & Plan:  1. Encounter for general adult medical examination without abnormal findings (Primary) - CBC with Differential/Platelet - Comprehensive metabolic panel with GFR - Lipid panel - TSH  2. Breast cancer screening by mammogram - MM 3D SCREENING MAMMOGRAM BILATERAL BREAST; Future  3. Fatigue, unspecified type - VITAMIN D  25 Hydroxy (Vit-D Deficiency, Fractures)  4. B12 deficiency - Vitamin B12   Orders  Placed This Encounter  Procedures   MM 3D SCREENING MAMMOGRAM BILATERAL BREAST    Standing Status:  Future    Expiration Date:   05/17/2025    Reason for Exam (SYMPTOM  OR DIAGNOSIS REQUIRED):   screening for breast cancer    Preferred imaging location?:   [facility]   CBC with Differential/Platelet   Comprehensive metabolic panel with GFR   Lipid panel   TSH   VITAMIN D  25 Hydroxy (Vit-D Deficiency, Fractures)   Vitamin B12    PATIENT COUNSELING:  - Encourage a healthy well-balanced diet. Patient may adjust caloric intake to maintain or achieve ideal body weight. May reduce intake of dietary saturated fat and total fat and have adequate dietary potassium and calcium  preferably from fresh fruits, vegetables, and low-fat dairy products.    - Stressed the importance of regular exercise  NEXT PREVENTATIVE PHYSICAL DUE IN 1 YEAR.  Return in about 1 year (around 05/17/2025) for Annual Physical Exam with fasting lab work.  Rosina Senters, FNP

## 2024-05-19 ENCOUNTER — Ambulatory Visit: Payer: Self-pay | Admitting: Internal Medicine

## 2024-06-03 ENCOUNTER — Other Ambulatory Visit: Payer: Self-pay | Admitting: Cardiology

## 2024-06-14 DIAGNOSIS — K080 Exfoliation of teeth due to systemic causes: Secondary | ICD-10-CM | POA: Diagnosis not present

## 2024-07-26 ENCOUNTER — Ambulatory Visit
Admission: RE | Admit: 2024-07-26 | Discharge: 2024-07-26 | Disposition: A | Discharge status: Home / Self Care | Source: Ambulatory Visit | Attending: Internal Medicine | Admitting: Internal Medicine

## 2024-07-26 DIAGNOSIS — Z1231 Encounter for screening mammogram for malignant neoplasm of breast: Secondary | ICD-10-CM

## 2024-09-07 ENCOUNTER — Ambulatory Visit: Attending: Student | Admitting: Student

## 2024-09-07 ENCOUNTER — Encounter: Payer: Self-pay | Admitting: Student

## 2024-09-07 VITALS — BP 144/76 | HR 73 | Ht 61.5 in | Wt 133.0 lb

## 2024-09-07 DIAGNOSIS — I471 Supraventricular tachycardia, unspecified: Secondary | ICD-10-CM | POA: Diagnosis not present

## 2024-09-07 DIAGNOSIS — Z79899 Other long term (current) drug therapy: Secondary | ICD-10-CM

## 2024-09-07 DIAGNOSIS — E7800 Pure hypercholesterolemia, unspecified: Secondary | ICD-10-CM | POA: Diagnosis not present

## 2024-09-07 DIAGNOSIS — I4719 Other supraventricular tachycardia: Secondary | ICD-10-CM

## 2024-09-07 NOTE — Patient Instructions (Addendum)
 Medication Instructions:  No medication changes today. *If you need a refill on your cardiac medications before your next appointment, please call your pharmacy*  Lab Work: No labwork ordered today. If you have labs (blood work) drawn today and your tests are completely normal, you will receive your results only by: MyChart Message (if you have MyChart) OR A paper copy in the mail If you have any lab test that is abnormal or we need to change your treatment, we will call you to review the results.  Testing/Procedures: No testing ordered today  Follow-Up: At [facility], you and your health needs are our priority.  As part of our continuing mission to provide you with exceptional heart care, our providers are all part of one team.  This team includes your primary Cardiologist (physician) and Advanced Practice Providers or APPs (Physician Assistants and Nurse Practitioners) who all work together to provide you with the care you need, when you need it.  Your next appointment:   12 month(s)  Provider:   You may see Ardeen Kohler, MD or one of the following Advanced Practice Providers on your designated Care Team:   Mertha Abrahams, Kennard Pea "Jonelle Neri" Bonanza, PA-C Suzann Riddle, NP Creighton Doffing, NP    We recommend signing up for the patient portal called "MyChart".  Sign up information is provided on this After Visit Summary.  MyChart is used to connect with patients for Virtual Visits (Telemedicine).  Patients are able to view lab/test results, encounter notes, upcoming appointments, etc.  Non-urgent messages can be sent to your provider as well.   To learn more about what you can do with MyChart, go to [email or web address].

## 2024-09-07 NOTE — Progress Notes (Signed)
" °  Electrophysiology Office Note:   Date:  09/07/2024  ID:  Peace, Noyes [birth year], MRN [id number]  Primary Cardiologist: Oneil Parchment, MD Electrophysiologist: Fonda Kitty, MD   Cardiology APP:  Army Katheryn BROCKS, NP (Inactive)  Electrophysiologist:  Fonda Kitty, MD      History of Present Illness:   [name] is a 68 y.o. female with h/o SVT, AT, and fatigue seen today for routine electrophysiology followup.   Since last being seen in our clinic the patient reports doing very well. No breakthrough arrhythmia of which she is aware. Overall, she denies chest pain, palpitations, dyspnea, PND, orthopnea, nausea, vomiting, dizziness, syncope, edema, weight gain, or early satiety.   Review of systems complete and found to be negative unless listed in HPI.   EP Information / Studies Reviewed:    EKG is ordered today. Personal review as below.  EKG Interpretation Date/Time:  Tuesday September 07 2024 10:18:56 EST Ventricular Rate:  73 PR Interval:  138 QRS Duration:  98 QT Interval:  412 QTC Calculation: 453 R Axis:   11  Text Interpretation: Normal sinus rhythm Stable intervals on flecainide  Confirmed by Lesia Sharper [phone]) on 09/07/2024 10:25:39 AM    Arrhythmia/Device History No specialty comments available.   Physical Exam:   VS:  BP (!) 144/76   Pulse 73   Ht 5' 1.5 (1.562 m)   Wt 133 lb (60.3 kg)   SpO2 99%   BMI 24.72 kg/m    Wt Readings from Last 3 Encounters:  09/07/24 133 lb (60.3 kg)  05/17/24 136 lb 9.6 oz (62 kg)  05/03/24 137 lb 6.4 oz (62.3 kg)     GEN: No acute distress NECK: No JVD; No carotid bruits CARDIAC: Regular rate and rhythm, no murmurs, rubs, gallops RESPIRATORY:  Clear to auscultation without rales, wheezing or rhonchi  ABDOMEN: Soft, non-tender, non-distended EXTREMITIES:  No edema; No deformity   ASSESSMENT AND PLAN:    SVT Atrial Tachycardia EKG today shows NSR with stable intervals Continue flecainide  50 mg  BID  HLD Continue statin     Follow up with Dr. Kitty in 12 months (Alternating 6 month visits for EKGs with gen cards)  Signed, Sharper Prentice Lesia, PA-C  "

## 2024-09-17 ENCOUNTER — Other Ambulatory Visit: Payer: Self-pay | Admitting: Internal Medicine

## 2024-09-30 ENCOUNTER — Other Ambulatory Visit: Payer: Self-pay

## 2024-09-30 ENCOUNTER — Encounter: Payer: Self-pay | Admitting: Physician Assistant

## 2024-09-30 ENCOUNTER — Ambulatory Visit: Admitting: Physician Assistant

## 2024-09-30 DIAGNOSIS — G8929 Other chronic pain: Secondary | ICD-10-CM | POA: Diagnosis not present

## 2024-09-30 DIAGNOSIS — M25561 Pain in right knee: Secondary | ICD-10-CM | POA: Diagnosis not present

## 2024-09-30 MED ORDER — METHYLPREDNISOLONE ACETATE 40 MG/ML IJ SUSP
40.0000 mg | INTRAMUSCULAR | Status: AC | PRN
  Administered 2024-09-30: 40 mg via INTRA_ARTICULAR

## 2024-09-30 MED ORDER — LIDOCAINE HCL 1 % IJ SOLN
3.0000 mL | INTRAMUSCULAR | Status: AC | PRN
  Administered 2024-09-30: 3 mL

## 2024-09-30 NOTE — Progress Notes (Signed)
 "  Office Visit Note   Patient: [name]           Date of Birth: 07-Feb-1956           MRN: [id number] Visit Date: 09/30/2024              Requested by: Billy Knee, FNP [address] [place],  [place] [address] PCP: Billy Knee, FNP   Assessment & Plan: Visit Diagnoses:  1. Chronic pain of right knee     Plan:  Will have her work on quad strengthening exercises as shown.  She will follow-up with us  in just 2 weeks see how she is doing.  Given the hemarthrosis of the knee and her positive Murray's if she continues to have pain would recommend MRI to rule out meniscal tear versus other internal derangement.  Questions were encouraged and answered at length.  Follow-Up Instructions: Return in about 2 weeks (around 10/14/2024).   Orders:  Orders Placed This Encounter  Procedures   XR Knee 1-2 Views Right   No orders of the defined types were placed in this encounter.     Procedures: Large Joint Inj: R knee on 09/30/2024 5:07 PM Indications: pain Details: 22 G 1.5 in needle, superolateral approach  Arthrogram: No  Medications: 3 mL lidocaine  1 %; 40 mg methylPREDNISolone  acetate 40 MG/ML Aspirate: 12 mL yellow and blood-tinged Outcome: tolerated well, no immediate complications Procedure, treatment alternatives, risks and benefits explained, specific risks discussed. Consent was given by the patient. Immediately prior to procedure a time out was called to verify the correct patient, procedure, equipment, support staff and site/side marked as required. Patient was prepped and draped in the usual sterile fashion.       Clinical Data: No additional findings.   Subjective: Chief Complaint  Patient presents with   Right Knee - Pain    HPI [name] 69 year old female was seen for right knee pain right leg pain that started after going up and down ladder while decorating a Christmas tree.  Pain does radiate from the mid calf down to ankle at times.  She  denies any numbness tingling.  She states the knee gives way with her at times.  Pain is worse with sitting and going up and down stairs.  She notes some swelling of the right knee compared to the left.  She will have a sudden sharp pain in the knee she does feel she has like some catching and is difficult to bend the knee at times.  No prior surgery or prior injuries to the knee.  She has been using ice ibuprofen Advil and Aleve.  Notes her swelling is worse at the end of the day.   Review of Systems  Constitutional:  Negative for chills and fever.     Objective: Vital Signs: There were no vitals taken for this visit.  Physical Exam Constitutional:      Appearance: She is not ill-appearing or diaphoretic.  Pulmonary:     Effort: Pulmonary effort is normal.  Neurological:     Mental Status: She is alert and oriented to person, place, and time.  Psychiatric:        Mood and Affect: Mood normal.     Ortho Exam Bilateral knees: Good range of motion of both knees.  Slight discomfort/apprehension with flexion right knee.  No instability valgus varus stressing of either knee.  No abnormal warmth erythema of either knee.  Right knee slight effusion.  McMurray's positive on  the right.  Anterior drawer right knee is negative. Specialty Comments:  No specialty comments available.  Imaging: XR Knee 1-2 Views Right Result Date: 09/30/2024 Right knee: 2 views: Knee is well located.  No acute fractures.  Mild patellofemoral changes.  No bony abnormalities or acute findings.    PMFS History: Patient Active Problem List   Diagnosis Date Noted   Mitral regurgitation 03/26/2022   Aortic atherosclerosis 09/19/2021   SVT (supraventricular tachycardia) 01/11/2020   Pure hypercholesterolemia 04/09/2010   History of colonic polyps 04/09/2010   Past Medical History:  Diagnosis Date   Anxiety    Aortic atherosclerosis 09/19/2021   Coronary calcium  score-0 in 2019, mild aortic atherosclerosis  noted   Depression    History of CT scan    Coronary calcium  CT 3/19: Calcium  score 0   HTN (hypertension) 05/21/2011   Hypercholesterolemia 04/09/2010   Qualifier: Diagnosis of  By: Delores CMA, Kelly     Hyperlipidemia    Menopause 03/11/2012   Mitral regurgitation    Echocardiogram 03/2022: EF 60-65, GLS -22, normal RVSF, normal PASP, mild MR, mild to mod TR, mild AI   Palpitations 07/08/2017   Polyp of colon 2009   benign   PVC's (premature ventricular contractions)    a. prior event monitor 02/2014: NSR, rare PVC.   SAB (spontaneous abortion)    Stress    situational   SVT (supraventricular tachycardia) 01/11/2020   SVT (supraventricular tachycardia) 01/11/2020   Atrial tachycardia/SVT/average 200 bpm on monitor 12/2019-flecanide, Dr. Waddell   Tiredness 03/11/2012   Trisomy 18    prior pregnancy with Trisomy 49    Family History  Problem Relation Age of Onset   Hypertension Mother    Heart attack Mother 38   Hypertension Father    Atrial fibrillation Father    Lung cancer Father        LUNG   Heart failure Father    Stroke Neg Hx    Breast cancer Neg Hx     Past Surgical History:  Procedure Laterality Date   CARPAL TUNNEL RELEASE Right 2004   CESAREAN SECTION     x 2 ( 1 with trisomy 33)   ELECTROPHYSIOLOGY STUDY N/A 02/25/2020   Procedure: ELECTROPHYSIOLOGY STUDY;  Surgeon: Waddell Danelle ORN, MD;  Location: [facility];  Service: Cardiovascular;  Laterality: N/A;   MYOMECTOMY     SVT ABLATION N/A 02/25/2020   Procedure: SVT ABLATION;  Surgeon: Waddell Danelle ORN, MD;  Location: [facility];  Service: Cardiovascular;  Laterality: N/A;   TOTAL ABDOMINAL HYSTERECTOMY W/ BILATERAL SALPINGOOPHORECTOMY  03/04/2006   Social History   Occupational History   Not on file  Tobacco Use   Smoking status: Never    Passive exposure: Never   Smokeless tobacco: Never  Vaping Use   Vaping status: Never Used  Substance and Sexual Activity   Alcohol  use: No   Drug  use: No   Sexual activity: Never        "

## 2024-10-18 ENCOUNTER — Ambulatory Visit: Admitting: Physician Assistant
